# Patient Record
Sex: Female | Born: 1940
Health system: Southern US, Community
[De-identification: ages and names within clinical notes are randomized; demographics above are authoritative.]

## PROBLEM LIST (undated history)

## (undated) DIAGNOSIS — G459 Transient cerebral ischemic attack, unspecified: Secondary | ICD-10-CM

## (undated) DIAGNOSIS — I10 Essential (primary) hypertension: Secondary | ICD-10-CM

## (undated) HISTORY — PX: DILATION AND CURETTAGE OF UTERUS: SHX78

## (undated) HISTORY — DX: Essential (primary) hypertension: I10

## (undated) HISTORY — PX: HEMORROIDECTOMY: SUR656

## (undated) HISTORY — PX: EYE SURGERY: SHX253

## (undated) HISTORY — PX: APPENDECTOMY: SHX54

---

## 1983-07-01 HISTORY — PX: BREAST SURGERY: SHX581

## 1994-06-30 HISTORY — PX: BREAST SURGERY: SHX581

## 2004-05-06 ENCOUNTER — Ambulatory Visit: Payer: Self-pay | Admitting: Family Medicine

## 2004-05-27 ENCOUNTER — Encounter (INDEPENDENT_AMBULATORY_CARE_PROVIDER_SITE_OTHER): Payer: Self-pay | Admitting: Specialist

## 2004-05-27 ENCOUNTER — Observation Stay (HOSPITAL_COMMUNITY): Admission: RE | Admit: 2004-05-27 | Discharge: 2004-05-28 | Payer: Self-pay | Admitting: Gynecology

## 2004-06-30 HISTORY — PX: ABDOMINAL HYSTERECTOMY: SHX81

## 2006-03-12 ENCOUNTER — Ambulatory Visit: Payer: Self-pay | Admitting: Obstetrics & Gynecology

## 2008-04-03 ENCOUNTER — Ambulatory Visit: Payer: Self-pay | Admitting: Family Medicine

## 2008-04-13 ENCOUNTER — Ambulatory Visit: Payer: Self-pay | Admitting: Otolaryngology

## 2008-08-01 ENCOUNTER — Ambulatory Visit: Payer: Self-pay | Admitting: Otolaryngology

## 2008-08-10 ENCOUNTER — Ambulatory Visit: Payer: Self-pay | Admitting: Otolaryngology

## 2010-11-15 NOTE — H&P (Signed)
NAME:  KENLI, WALDO NO.:  [id number]   MEDICAL RECORD NO.:  [id number]          PATIENT TYPE:  AMB   LOCATION:  [facility]C                           [facility]ACILITY:  [facility]   PHYSICIAN:  Ginger Carne, MD  DATE OF BIRTH:  1941/03/10   DATE OF ADMISSION:  DATE OF DISCHARGE:                                HISTORY & PHYSICAL   SCHEDULED DATE OF SURGERY:  May 27, 2004   ADMITTING DIAGNOSES:  1.  Postmenopausal bleeding.  2.  Right ovarian cyst.   PROPOSED PROCEDURE:  Total vaginal hysterectomy, bilateral salpingo-  oophorectomy, with possible laparoscopic approach.   HISTORY OF PRESENT ILLNESS:  This is a 70 year old Caucasian female gravida  8 para 3-0-5-3 admitted for the aforementioned procedures because of a 2-  year history of postmenopausal bleeding.  The patient was seen initially by  another gynecological practice in [place], at which time an endometrial  biopsy for postmenopausal bleeding revealed benign findings including  atrophic lining.  A sonogram performed abdominally due to the patient  declining a vaginal approach demonstrated the uterus to be of normal size  with an endometrial stripe of 6 mm.  She has not been on hormone replacement  therapy.  In addition, a right ovarian cyst measuring approximately 3 cm was  also noted.  The patient has had sequential ultrasounds over this period of  time, approximately 4 to 6 months apart, which demonstrated no change in the  endometrial thickening, in addition to the cyst remaining stable.  The  patient has since not been amenable to a transvaginal scan and has declined  an endometrial biopsy.  She had continued to have postmenopausal bleeding  and is concerned and has anxiety related to the possibilities of carcinoma  of the endometrium and the nature of said ovarian cyst on the right ovary.  CA-125 and CEA were normal.   OBSTETRICAL AND GYNECOLOGICAL HISTORY:  The patient has had three full-term  vaginal  deliveries and has had five first trimester miscarriages with a  report of Rh sensitization in one of the past pregnancies - she is uncertain  as to which one.   MEDICAL HISTORY:  Hypertension.   PAST SURGICAL HISTORY:  Includes two right breast cysts which were  aspirated.  She has had bilateral laser surgery for glaucoma and has had her  appendix removed in the past.   MEDICATIONS:  Maxzide 32.5/25 daily.   ALLERGIES:  1.  DEMEROL.  2.  DARVOCET.  3.  PENICILLIN.  4.  IODINE.   FAMILY HISTORY:  Positive for mother having ovarian carcinoma and her  brother having a myocardial infarction.   SOCIAL HISTORY:  Negative for smoking, illicit drug abuse, or alcohol abuse.   REVIEW OF SYSTEMS:  Normal.   PHYSICAL EXAMINATION:  VITAL SIGNS:  Blood pressure 126/82, height 5 feet 1  inch, weight 118 pounds.  HEENT:  Grossly normal.  BREAST:  Without masses, discharge, thickenings, or tenderness.  Recent  mammogram within normal limits.  CHEST:  Clear to percussion and auscultation.  CARDIOVASCULAR:  Without murmurs or enlargements, regular  rate and rhythm.  VASCULAR, EXTREMITY, LYMPHATIC, SKIN, NEUROLOGICAL, MUSCULOSKELETAL:  Within  normal limits.  ABDOMEN:  Supple without gross hepatosplenomegaly.  PELVIC:  External genitalia, vulva, and vagina normal.  Cervix smooth  without erosions or lesions.  The uterus is small, anteverted, and flexed.  Both adnexa are palpable and clinically found to be normal.  Rectal exam is  hemoccult negative without masses.   IMPRESSION:  1.  Stable but persistent right ovarian cyst, 2.5 cm.  2.  Postmenopausal bleeding with a greater than 1 year history of a normal      endometrial biopsy and activities of daily living sonogram findings of      an endometrial stripe at 6 mm.   PLAN:  The patient's apprehension about these findings is tempered by her  lack of appreciation about the benign nature of a stable small ovarian cyst  in a postmenopausal  woman and her refusal to have a transvaginal ultrasound  in addition to an endometrial biopsy which would better delineate the  etiology and pathology of these lesions.  [name] extremely worried about the  possibility of carcinoma, based on her mother's past history.  She has  sought advice from several gynecologists, as well as a family doctor in the  area, but it appears that she has never been given determinate options.  She  states that she is uncomfortable living with postmenopausal bleeding and she  is equally uncomfortable with having these findings, although stable,  persist.  [name] well aware that the information provided to her is somewhat  qualified because of her limitations in testing.   In the end, however, the patient does not wish to persist or continue with  any further testing and would be more comfortable in undergoing a total  vaginal hysterectomy, bilateral salpingo-oophorectomy, with possible  laparoscopic assistance in removal of atrophic ovaries.  She understands  that in all likelihood the tissue pathology for both the ovaries uterus, and  cervix will be benign.  However, there is certainly a small but not 0%  chance that there could be underlying pathology that has gone unnoticed and  not been detected because of limitations in testing.  The nature of said  procedure discussed in detail with the patient.  Risks including injuries to  ureter, bowel, and bladder; possible conversion to a laparoscopic or open  procedure; hemorrhage possibly requiring blood transfusion; infection; and  other unforeseen complications were discussed and understood by said  patient.  [name] scheduled for such surgery May 27, 2004.     Stev   SHB/MEDQ  D:  05/22/2004  T:  05/22/2004  Job:  [id number]

## 2010-11-15 NOTE — Discharge Summary (Signed)
NAME:  Mariah Levy, Mariah Levy NO.:  [id number]   MEDICAL RECORD NO.:  [id number]          PATIENT TYPE:  OBV   LOCATION:  9315                          [facility]ILITY:  [facility]   PHYSICIAN:  Ginger Carne, MD  DATE OF BIRTH:  [birth year]   DATE OF ADMISSION:  05/27/2004  DATE OF DISCHARGE:  05/28/2004                                 DISCHARGE SUMMARY   FINAL DIAGNOSIS:  Postmenopausal bleeding with right paratubal cyst.   IN HOSPITAL PROCEDURES:  Total vaginal hysterectomy with bilateral salpingo-  oophorectomy.   HOSPITAL COURSE:  This is a 70 year old Caucasian female who underwent the  aforementioned procedures on May 27, 2004.  Postoperative course was  uneventful.  Blood pressure on discharge was 123/56, the temperature 97.8.  Hemoglobin was 11.4 from preoperative of 14.5 and hematocrit of 32.6 and  preoperative of 42.2.  She had no specific complaints.  The lungs were  clear.  Cardiac status within normal limits and her calves demonstrated  bilaterally no tenderness.  She had no vaginal bleeding and no difficulty  with voiding.  The abdomen was soft.   Routine discharge instructions were provided to said patient including  avoiding constipation.  The patient was advised to use Colace 100 mg twice a  day and Dulcolax tablets as needed, increase fluids and roughage.  She was  also given Dilaudid 2 mg, one tablet every four to six hours as necessary  for pain.  She was advised to contact the office if she had temperature  elevation above 100.4 degrees Fahrenheit, vaginal bleeding, increasing  discharge, difficulty with voiding or increasing pelvic or abdominal pain.  The patient was advised to resume all activities and she will be seen in the  office in four weeks time for follow up postoperative care.     Stev   SHB/MEDQ  D:  05/28/2004  T:  05/28/2004  Job:  [id number]

## 2010-11-15 NOTE — Op Note (Signed)
NAME:  Mariah Levy, Mariah Levy                 ACCOUNT NO.:  [id number]   MEDICAL RECORD NO.:  [id number]          PATIENT TYPE:  AMB   LOCATION:  [facility]C                           [facility]   PHYSICIAN:  Ginger Carne, MD  DATE OF BIRTH:  02-Jun-1941   DATE OF PROCEDURE:  05/27/2004  DATE OF DISCHARGE:                                 OPERATIVE REPORT   PREOPERATIVE DIAGNOSES:  Postmenopausal bleeding, thickened endometrial  lining and right ovarian cyst.   POSTOPERATIVE DIAGNOSES:  Postmenopausal bleeding, thickened right  endometrial lining and right paratubal cyst.   PROCEDURE:  Total vaginal hysterectomy, bilateral salpingo-oophorectomy.   SURGEON:  Ginger Carne, MD   ASSISTANT:  Bing Neighbors. Delcambre, MD   ESTIMATED BLOOD LOSS:  Less than 75 mL.   COMPLICATIONS:  None immediate.   ANESTHESIA:  __________ with general.   SPECIMENS:  Right and left tube and ovary with uterus and cervix.   FINDINGS:  External genitalia, vulva and vagina normal, cervix smooth  without erosions or lesions.  The uterus was small consistent with the  patient's age, left tube and ovary were atrophic, right tube and ovary  appeared normal. There was a 2 cm right paratubal cyst which was simple in  appearance and when opened appeared to be free of excrescences.   DESCRIPTION OF PROCEDURE:  The patient was prepped and draped in the usual  fashion and placed in the lithotomy position. Betadine solution used for  antiseptic and the patient was catheterized prior to the procedure. After  adequate general anesthesia, copious irrigation with lactated Ringer's was  followed by grasping the anterior and posterior lips of the vagina. 20 mL of  Marcaine with epinephrine was injected circumferentially around the cervix.  This was then followed by opening the anterior and posterior vaginal  epithelium and peritoneal reflections opened without injury to their  respective organs. Uterosacral cardinal ligament  complexes were clamped, cut  and ligated attached to their respective vaginal cuff walls.  The uterine  vasculature with its ascending branches were clamped, cut, and ligated with  #0 Vicryl suture including the broad ligaments. The uteroovarian ligaments  were then clamped, cut, and ligated with #0 Vicryl suture and then attention  was paid to complete removal of both right and left tube and ovary with the  right paratubal cyst included.  Double tying with #0 Vicryl sutures were  placed on these  stumps to assure hemostasis, no active bleeding noted. Cuff closed with a  single layer of #0 Vicryl running interlocking suture.  The patient  tolerated the procedure well and transported to anesthesia recovery room in  excellent condition.     Stev   SHB/MEDQ  D:  05/27/2004  T:  05/27/2004  Job:  [id number]

## 2010-11-15 NOTE — Group Therapy Note (Signed)
NAME:  Mariah Levy, IGO NO.:  [id number]   MEDICAL RECORD NO.:  [id number]          PATIENT TYPE:  POB   LOCATION:  [facility]                   FACILITY:  [facility]   PHYSICIAN:  Elsie Lincoln, MD      DATE OF BIRTH:  09-01-1940   DATE OF SERVICE:  03/12/2006                                    CLINIC NOTE   Patient is a 70 year old female status post total hysterectomy/BSO in 2005  who presents complaining of vaginal irritation.  Reports it started seven  weeks ago with one spot on the left side and has then progressed to her  vagina, and vulvar area is very itchy.  She denies any history of yeast  infections, even as a young woman.  Denies any change in sexual partners.  She has been married for the last 47 years to the same man.  Denies using  soap in her vaginal area.  She uses a shower head to cleanse daily.  Denies  douching or using feminine deodorant.  [name] not on any estrogen therapy.   PHYSICAL EXAMINATION:  VITAL SIGNS:  Blood pressure 163/90, pulse 75.  Weight 116.  Height 5 feet 1.  GENERAL:  A well-developed and well-nourished elderly female who appears  very anxious.  ABDOMEN:  Soft, nontender, nondistended.  GU:  External genitalia is very erythematous.  Her labia majora and minora  have a shiny appearance, consistent with estrogen deficiency.  Vaginal  mucosa is pink and irritated-appearing.  Vaginal exam reveals a normal-  appearing vaginal cuff.  No ulcers or lesions noted.   ASSESSMENT/PLAN:  1. Pruritus secondary to estrogen deficiency:  Wet prep today within      normal limits.  Recommend patient start using Premarin cream 1 gm per      vagina daily x1 week, then 1 gm twice daily.  Patient was also      counseled to avoid sitting in the pool water, which she also reports.      If this does not relieve her problem, she is to return for followup.  2. Hypertension:  Patient is followed by primary care physician for this.      [name] to continue  her Maxzide.  She reports that she is usually in the      120s/70s, so her elevated pressure is probably secondary to __________.     ______________________________  Carolanne Grumbling, M.D.    ______________________________  Elsie Lincoln, MD    TW/MEDQ  D:  03/12/2006  T:  03/12/2006  Job:  [id number]

## 2010-12-25 ENCOUNTER — Ambulatory Visit: Payer: Self-pay | Admitting: Ophthalmology

## 2011-01-22 ENCOUNTER — Ambulatory Visit: Payer: Self-pay | Admitting: Ophthalmology

## 2012-10-12 ENCOUNTER — Encounter: Payer: Self-pay | Admitting: Obstetrics and Gynecology

## 2012-10-12 ENCOUNTER — Ambulatory Visit (INDEPENDENT_AMBULATORY_CARE_PROVIDER_SITE_OTHER): Payer: MEDICARE | Admitting: Obstetrics and Gynecology

## 2012-10-12 VITALS — BP 152/83 | HR 76 | Ht 61.0 in | Wt 100.2 lb

## 2012-10-12 DIAGNOSIS — Z1239 Encounter for other screening for malignant neoplasm of breast: Secondary | ICD-10-CM

## 2012-10-12 NOTE — Progress Notes (Signed)
  Subjective:    Patient ID: Mariah Levy, female    DOB: 15-Oct-1940, 72 y.o.   MRN: [id number]  HPI  72 yo postmenopausal female here for evaluation of redness on right breast. Patient reports noticing a bright red area on breast a week ago which has gradually started to fade. The area is not painful but she was worried enough to want to have it evaluated given her history of lumpectomy a few years ago. Patient is also complaining of vulva itching and burning on occasions. She denies any discharge but states a sharp pain occurs every once an a while. Patient has not had a mammogram in over a year and half and is not interested in having a pelvic exam either.  Past Medical History  Diagnosis Date  . Hypertension    Past Surgical History  Procedure Laterality Date  . Breast surgery Right 1985    lumpectomy x 2  . Breast surgery Right 1996    milk duct removal  . Abdominal hysterectomy  2006  . Eye surgery      cataract/ glaucoma   History   Social History  . Marital Status: Married    Spouse Name: N/A    Number of Children: N/A  . Years of Education: N/A   Occupational History  . Not on file.   Social History Main Topics  . Smoking status: Never Smoker   . Smokeless tobacco: Never Used  . Alcohol Use: Not on file  . Drug Use: Not on file  . Sexually Active: Not on file   Other Topics Concern  . Not on file   Social History Narrative  . No narrative on file   Family History  Problem Relation Age of Onset  . Hypertension Mother   . Cancer Father      Review of Systems  All other systems reviewed and are negative.       Objective:   Physical Exam GENERAL: Well-developed, well-nourished female in no acute distress.  NECK: Supple. Normal thyroid.  BREASTS: Symmetric in size. No palpable masses or lymphadenopathy, skin changes, or nipple drainage. Small 0.5 cm area or erythema on right breast similar to insect bite. ABDOMEN: Soft, nontender, nondistended. No  organomegaly. PELVIC: Normal external female genitalia with atrophic changes to skin of labia minora and clitoral hood as well as introitus EXTREMITIES: No cyanosis, clubbing, or edema, 2+ distal pulses.      Assessment & Plan:  72 yo with normal breast exam - Referral for breast mammogram provided - Explained to patient that vulva discomfort may be due to atrophy secondary to lack pf estrogen but cannot rule out VIN. Patient declined any medical intervention at this time. - RTC prn - Patient stated she is scheduled for physical exam with PCP in

## 2012-10-12 NOTE — Progress Notes (Signed)
Here today for red spot on right breast with some pain associated.  Has a history of breast lumpectomy x 2 both were benign.  Also has noticed a lump/knot on her neck (right side).  Has a intense itch at her "pelvic bone" area and a red spot on her right labia that will not go away.

## 2014-10-23 DIAGNOSIS — H3531 Nonexudative age-related macular degeneration: Secondary | ICD-10-CM | POA: Diagnosis not present

## 2014-10-31 DIAGNOSIS — L570 Actinic keratosis: Secondary | ICD-10-CM | POA: Diagnosis not present

## 2014-10-31 DIAGNOSIS — L578 Other skin changes due to chronic exposure to nonionizing radiation: Secondary | ICD-10-CM | POA: Diagnosis not present

## 2014-12-05 DIAGNOSIS — D2261 Melanocytic nevi of right upper limb, including shoulder: Secondary | ICD-10-CM | POA: Diagnosis not present

## 2014-12-05 DIAGNOSIS — L570 Actinic keratosis: Secondary | ICD-10-CM | POA: Diagnosis not present

## 2014-12-20 DIAGNOSIS — S61219A Laceration without foreign body of unspecified finger without damage to nail, initial encounter: Secondary | ICD-10-CM | POA: Diagnosis not present

## 2014-12-20 DIAGNOSIS — S61217A Laceration without foreign body of left little finger without damage to nail, initial encounter: Secondary | ICD-10-CM | POA: Diagnosis not present

## 2014-12-20 DIAGNOSIS — S62639B Displaced fracture of distal phalanx of unspecified finger, initial encounter for open fracture: Secondary | ICD-10-CM | POA: Diagnosis not present

## 2014-12-22 DIAGNOSIS — S68125A Partial traumatic metacarpophalangeal amputation of left ring finger, initial encounter: Secondary | ICD-10-CM | POA: Diagnosis not present

## 2014-12-27 DIAGNOSIS — S68129D Partial traumatic metacarpophalangeal amputation of unspecified finger, subsequent encounter: Secondary | ICD-10-CM | POA: Diagnosis not present

## 2015-01-15 ENCOUNTER — Ambulatory Visit
Admission: RE | Admit: 2015-01-15 | Discharge: 2015-01-15 | Disposition: A | Discharge status: Home / Self Care | Payer: MEDICARE | Source: Ambulatory Visit | Attending: Family Medicine | Admitting: Family Medicine

## 2015-01-15 ENCOUNTER — Ambulatory Visit (INDEPENDENT_AMBULATORY_CARE_PROVIDER_SITE_OTHER): Payer: MEDICARE | Admitting: Family Medicine

## 2015-01-15 ENCOUNTER — Encounter: Payer: Self-pay | Admitting: Family Medicine

## 2015-01-15 VITALS — BP 146/80 | HR 76 | Temp 98.2°F | Resp 16 | Ht 61.0 in | Wt 101.0 lb

## 2015-01-15 DIAGNOSIS — S68129D Partial traumatic metacarpophalangeal amputation of unspecified finger, subsequent encounter: Secondary | ICD-10-CM | POA: Diagnosis not present

## 2015-01-15 DIAGNOSIS — J338 Other polyp of sinus: Secondary | ICD-10-CM | POA: Insufficient documentation

## 2015-01-15 DIAGNOSIS — R109 Unspecified abdominal pain: Secondary | ICD-10-CM

## 2015-01-15 DIAGNOSIS — E7800 Pure hypercholesterolemia, unspecified: Secondary | ICD-10-CM | POA: Insufficient documentation

## 2015-01-15 DIAGNOSIS — I10 Essential (primary) hypertension: Secondary | ICD-10-CM | POA: Insufficient documentation

## 2015-01-15 DIAGNOSIS — G4730 Sleep apnea, unspecified: Secondary | ICD-10-CM | POA: Insufficient documentation

## 2015-01-15 DIAGNOSIS — K5900 Constipation, unspecified: Secondary | ICD-10-CM

## 2015-01-15 DIAGNOSIS — R198 Other specified symptoms and signs involving the digestive system and abdomen: Secondary | ICD-10-CM

## 2015-01-15 DIAGNOSIS — R194 Change in bowel habit: Secondary | ICD-10-CM | POA: Diagnosis not present

## 2015-01-15 DIAGNOSIS — R197 Diarrhea, unspecified: Secondary | ICD-10-CM | POA: Diagnosis not present

## 2015-01-15 NOTE — Progress Notes (Signed)
Subjective:    Patient ID: Mariah Levy, female    DOB: 1940/10/09, 74 y.o.   MRN: [id number]  Constipation This is a new problem. The current episode started 1 to 4 weeks ago. The stool is described as pellet like and oily. Associated symptoms include abdominal pain (Lower abdominal pain/cramping.), bloating and diarrhea. Pertinent negatives include no difficulty urinating, fever, nausea, rectal pain or vomiting.   Does not really feel constipated.  Was previously regular. Had impaction and it got better.  Does still have impaction initially and then normal BM.  Then had mucus coming out. Now regular since then. Does have mucus and marbles at the time.  Happened several times.    Has not taken any medication for it.  No fevers.    For finger, is now  infected. Was on antibiotic previously. Not currently. Is not in the bone. X-ray was negative.  Patient Active Problem List   Diagnosis Date Noted  . Hypercholesteremia 01/15/2015  . BP (high blood pressure) 01/15/2015  . Polyp of nasal sinus 01/15/2015  . Apnea, sleep 01/15/2015   Family History  Problem Relation Age of Onset  . Hypertension Mother   . Arthritis Mother   . Stroke Mother   . Cancer Father   . Aneurysm Father   . Hypertension Brother   . Diabetes Brother   . Coronary artery disease Brother   . Cancer Brother   . COPD Brother   . Cancer Brother     lung  . Early death Brother     Died as infant  . Pneumonia Brother   . Diabetes Son   . Diabetes Maternal Grandmother    History   Social History  . Marital Status: Married    Spouse Name: N/A  . Number of Children: 3  . Years of Education: H/S   Occupational History  . Retired    Social History Main Topics  . Smoking status: Former Smoker -- 1.00 packs/day for 2 years    Quit date: 06/30/1959  . Smokeless tobacco: Never Used  . Alcohol Use: 4.2 oz/week    7 Glasses of wine per week     Comment: drinks wine with dinner  . Drug Use: No  . Sexual  Activity: Not on file   Other Topics Concern  . Not on file   Social History Narrative   Allergies  Allergen Reactions  . Atacand  [Candesartan] Shortness Of Breath  . Iodine Shortness Of Breath  . Demerol [Meperidine] Hives  . Ramipril Swelling    Paresthesias.  . Penicillins Rash   Previous Medications   ASPIRIN 81 MG TABLET    Take by mouth.   TRIAMTERENE-HYDROCHLOROTHIAZIDE (MAXZIDE) 75-50 MG PER TABLET    Take by mouth.   Allergies  Allergen Reactions  . Atacand  [Candesartan] Shortness Of Breath  . Iodine Shortness Of Breath  . Demerol [Meperidine] Hives  . Ramipril Swelling    Paresthesias.  . Penicillins Rash   Previous Medications   ASPIRIN 81 MG TABLET    Take by mouth.   TRIAMTERENE-HYDROCHLOROTHIAZIDE (MAXZIDE) 75-50 MG PER TABLET    Take by mouth.   BP 146/80 mmHg  Pulse 76  Temp(Src) 98.2 F (36.8 C) (Oral)  Resp 16  Ht 5\' 1"  (1.549 m)  Wt 101 lb (45.813 kg)  BMI 19.09 kg/m2    Review of Systems  Constitutional: Negative for fever, chills, diaphoresis, activity change, appetite change, fatigue and unexpected weight change.  Gastrointestinal: Positive  for abdominal pain (Lower abdominal pain/cramping.), diarrhea, constipation, abdominal distention (Maybe slightly.) and bloating. Negative for nausea, vomiting, blood in stool, anal bleeding and rectal pain.  Genitourinary: Negative for dysuria, urgency, frequency, hematuria, flank pain, decreased urine volume, vaginal bleeding, vaginal discharge, enuresis, difficulty urinating, genital sores, vaginal pain, menstrual problem, pelvic pain and dyspareunia.       Objective:   Physical Exam  Constitutional: She appears well-developed and well-nourished.  Cardiovascular: Normal rate and regular rhythm.   Pulmonary/Chest: Effort normal and breath sounds normal.  Abdominal: Soft. Bowel sounds are normal. She exhibits distension. There is no tenderness. There is no rebound.  Hyperactive bowel sounds.    BP  146/80 mmHg  Pulse 76  Temp(Src) 98.2 F (36.8 C) (Oral)  Resp 16  Ht 5\' 1"  (1.549 m)  Wt 101 lb (45.813 kg)  BMI 19.09 kg/m2     Assessment & Plan:  1. Constipation, unspecified constipation type Will check X-rays and reassess.  - DG Abd 2 Views; Future  2. Change in bowel function Unclear if diarrhea or constipation. Will assess before treating.   3. Diarrhea Will check C diff. Has been on antibiotic recently.  - Stool C-Diff Toxin Assay  4. Abdominal pain, unspecified abdominal location Will check labs and X-ray and treat accordingly. Call if pain worsens or develops fever.  - CBC with Differential/Platelet - Comprehensive metabolic panel - DG Abd 2 Views; Future  Margarita Rana, MD

## 2015-01-16 ENCOUNTER — Telehealth: Payer: Self-pay

## 2015-01-16 DIAGNOSIS — R197 Diarrhea, unspecified: Secondary | ICD-10-CM | POA: Diagnosis not present

## 2015-01-16 LAB — CBC WITH DIFFERENTIAL/PLATELET
BASOS ABS: 0 10*3/uL (ref 0.0–0.2)
Basos: 0 %
EOS (ABSOLUTE): 0.1 10*3/uL (ref 0.0–0.4)
Eos: 2 %
HEMOGLOBIN: 13.5 g/dL (ref 11.1–15.9)
Hematocrit: 39.7 % (ref 34.0–46.6)
Immature Grans (Abs): 0 10*3/uL (ref 0.0–0.1)
Immature Granulocytes: 0 %
Lymphocytes Absolute: 3.2 10*3/uL — ABNORMAL HIGH (ref 0.7–3.1)
Lymphs: 39 %
MCH: 31.6 pg (ref 26.6–33.0)
MCHC: 34 g/dL (ref 31.5–35.7)
MCV: 93 fL (ref 79–97)
MONOCYTES: 13 %
MONOS ABS: 1.1 10*3/uL — AB (ref 0.1–0.9)
NEUTROS ABS: 3.7 10*3/uL (ref 1.4–7.0)
NEUTROS PCT: 46 %
Platelets: 351 10*3/uL (ref 150–379)
RBC: 4.27 x10E6/uL (ref 3.77–5.28)
RDW: 13.8 % (ref 12.3–15.4)
WBC: 8.1 10*3/uL (ref 3.4–10.8)

## 2015-01-16 LAB — COMPREHENSIVE METABOLIC PANEL
A/G RATIO: 1.7 (ref 1.1–2.5)
ALBUMIN: 4.5 g/dL (ref 3.5–4.8)
ALK PHOS: 47 IU/L (ref 39–117)
ALT: 21 IU/L (ref 0–32)
AST: 28 IU/L (ref 0–40)
BUN/Creatinine Ratio: 22 (ref 11–26)
BUN: 16 mg/dL (ref 8–27)
Bilirubin Total: 0.4 mg/dL (ref 0.0–1.2)
CALCIUM: 9.7 mg/dL (ref 8.7–10.3)
CHLORIDE: 99 mmol/L (ref 97–108)
CO2: 25 mmol/L (ref 18–29)
Creatinine, Ser: 0.72 mg/dL (ref 0.57–1.00)
GFR calc Af Amer: 95 mL/min/{1.73_m2} (ref >59)
GFR calc non Af Amer: 83 mL/min/{1.73_m2} (ref >59)
Globulin, Total: 2.6 g/dL (ref 1.5–4.5)
Glucose: 67 mg/dL (ref 65–99)
Potassium: 4.6 mmol/L (ref 3.5–5.2)
Sodium: 141 mmol/L (ref 134–144)
Total Protein: 7.1 g/dL (ref 6.0–8.5)

## 2015-01-16 NOTE — Telephone Encounter (Signed)
-----   Message from Margarita Rana, MD sent at 01/16/2015  8:16 AM EDT ----- Last stable.  No sign of infection. C Diff toxin pending. Abdominal Xray does show constipation.  Recommend Miralax every night and colace twice a day. Call if any fever or worsening. Thanks. Will cal back when c diff culture completed. Thanks.

## 2015-01-16 NOTE — Telephone Encounter (Signed)
Informed pt of results. Renaldo Fiddler, CMA

## 2015-01-16 NOTE — Telephone Encounter (Signed)
LMTCB 01/16/2015  Thanks,   -Mickel Baas

## 2015-01-18 ENCOUNTER — Telehealth: Payer: Self-pay

## 2015-01-18 LAB — CLOSTRIDIUM DIFFICILE EIA: C difficile Toxins A+B, EIA: NEGATIVE

## 2015-01-18 NOTE — Telephone Encounter (Signed)
-----   Message from Margarita Rana, MD sent at 01/18/2015 10:30 AM EDT ----- C diff negative. Please see how patient is doing. Thanks.

## 2015-01-18 NOTE — Telephone Encounter (Signed)
Pt advised.  She says she is still a little constipated but seems to be improving some.  I advised her to let us know if it doesn't continue to improve or gets worse.  Thanks,   -Mickel Baas

## 2015-01-30 DIAGNOSIS — L853 Xerosis cutis: Secondary | ICD-10-CM | POA: Diagnosis not present

## 2015-01-30 DIAGNOSIS — Z808 Family history of malignant neoplasm of other organs or systems: Secondary | ICD-10-CM | POA: Diagnosis not present

## 2015-01-30 DIAGNOSIS — L821 Other seborrheic keratosis: Secondary | ICD-10-CM | POA: Diagnosis not present

## 2015-01-30 DIAGNOSIS — Z1283 Encounter for screening for malignant neoplasm of skin: Secondary | ICD-10-CM | POA: Diagnosis not present

## 2015-02-14 DIAGNOSIS — S68129D Partial traumatic metacarpophalangeal amputation of unspecified finger, subsequent encounter: Secondary | ICD-10-CM | POA: Diagnosis not present

## 2015-05-21 ENCOUNTER — Encounter: Payer: Self-pay | Admitting: Family Medicine

## 2015-05-21 ENCOUNTER — Telehealth: Payer: Self-pay | Admitting: Family Medicine

## 2015-05-21 ENCOUNTER — Ambulatory Visit
Admission: RE | Admit: 2015-05-21 | Discharge: 2015-05-21 | Disposition: A | Discharge status: Home / Self Care | Payer: MEDICARE | Source: Ambulatory Visit | Attending: Family Medicine | Admitting: Family Medicine

## 2015-05-21 ENCOUNTER — Ambulatory Visit (INDEPENDENT_AMBULATORY_CARE_PROVIDER_SITE_OTHER): Payer: MEDICARE | Admitting: Family Medicine

## 2015-05-21 VITALS — BP 124/88 | HR 76 | Temp 97.7°F | Resp 14 | Wt 100.4 lb

## 2015-05-21 DIAGNOSIS — G459 Transient cerebral ischemic attack, unspecified: Secondary | ICD-10-CM | POA: Diagnosis not present

## 2015-05-21 DIAGNOSIS — R200 Anesthesia of skin: Secondary | ICD-10-CM | POA: Diagnosis not present

## 2015-05-21 DIAGNOSIS — R42 Dizziness and giddiness: Secondary | ICD-10-CM | POA: Insufficient documentation

## 2015-05-21 DIAGNOSIS — E785 Hyperlipidemia, unspecified: Secondary | ICD-10-CM

## 2015-05-21 DIAGNOSIS — Z8673 Personal history of transient ischemic attack (TIA), and cerebral infarction without residual deficits: Secondary | ICD-10-CM | POA: Diagnosis not present

## 2015-05-21 DIAGNOSIS — I10 Essential (primary) hypertension: Secondary | ICD-10-CM | POA: Insufficient documentation

## 2015-05-21 NOTE — Telephone Encounter (Signed)
Too short staffed to see her today. Needs to see mid-level or go to ER. Thanks.

## 2015-05-21 NOTE — Telephone Encounter (Signed)
Patient has appointment scheduled for tomorrow at 10:45   Thanks,  -JER

## 2015-05-21 NOTE — Telephone Encounter (Signed)
Does also have some facial numbness today.   Had weakness last night.  Will schedule ov today.  Thanks.

## 2015-05-21 NOTE — Patient Instructions (Signed)
Continue blood pressure medication and 81 mg. Aspirin. Get you blood drawn fasting for lipid profile.

## 2015-05-21 NOTE — Telephone Encounter (Signed)
Pt called experiencing vertigo and weakness this weekend.  Can we work her in today and can she wait until tomorrow.    Please call patient [phone]  Thanks Con Memos

## 2015-05-21 NOTE — Telephone Encounter (Signed)
Spoke with dr. Venia Minks to verify that pt should come in today and not wait until tomorrow. Dr. Venia Minks would like a nurse to triage pt. I spoke with Roshena and she is going to triage pt. Thanks TNP

## 2015-05-21 NOTE — Telephone Encounter (Signed)
Can not work in today. Can see mid-level or ok to work in tomorrow. Thanks.

## 2015-05-21 NOTE — Telephone Encounter (Signed)
Called Mariah Levy and she didn't want to see anyone other than Dr. Venia Minks. I schedule Mariah Levy tomorrow at 1045. Mariah Levy stated she had a sever attack of vertigo in the middle of the night and it last a couple hours. Mariah Levy stated she was concerned b/c it felt like her left leg couldn't support her. Thanks TNP

## 2015-05-21 NOTE — Progress Notes (Signed)
Subjective:     Patient ID: Mariah Levy, female   DOB: 08-25-1940, 74 y.o.   MRN: [id number]  HPI  Chief Complaint  Patient presents with  . Dizziness    Patient comes in office today with concerns of vertigo. Patient states that episode began yesterday around 2:30, patient reports today upon standing in her bedroom it felt like the room was turning and her legs buckled.   States she started to get up as she usually does at 2:30 in the AM 11/20. Had significant vertigo and when she tried to get to the bathroom her left leg buckled. She returned to bed and when she arose again her prior sx were gone but she had left facial paresthesias which persist. Reports prior TIA associated with vertigo 20 years ago. Reports compliance with bp medication and ASA though might have missed one day at the end of last week. Last lipid profile 2012 with LDL 134 and HDL 84.   Review of Systems  Cardiovascular:       Noticed transient heart racing when she got scared about her sx.       Objective:   Physical Exam  Constitutional: She appears well-developed and well-nourished. No distress.  HENT:  Bilateral obstructing ear cerumen.  Eyes: EOM are normal. Pupils are equal, round, and reactive to light.  Neck:  No carotid bruits  Cardiovascular: Normal rate and regular rhythm.   Pulmonary/Chest: Breath sounds normal.  Neurological:  Symmetric face with smile/tongue protruded at midline with good lateral movement.Finger to Nose and Heel to Shin WNL. Romberg negative. M.S.: grips and lower extremities 5/5.       Assessment:    1. Transient cerebral ischemia, unspecified transient cerebral ischemia type  - CT Head Wo Contrast; Future - US Carotid Duplex Bilateral; Future  2. Hyperlipidemia - Lipid panel    Plan:    Discussed reporting to the ER if her sx return or worsen. Case discussed with her primary M.D., Dr. Venia Minks. Continue with bp medication and ASA.

## 2015-05-21 NOTE — Telephone Encounter (Signed)
Called patient. She states that on Sunday morning at 2:30am she woke up to go to the restroom. She states the room felt like it was spinning and her left leg was weak like it was about to give out on her. She also has a headache, haert palpitations and felt out of breath just walking to the restroom. Patient states her husband helped her back into bed and she lay there for several hours until symptoms resolved. Patient states Today she is not having any vertigo symptoms or weakness in her leg. She states she is having some numbness on the left side of her face. Patient denies any slurred speech, blurry vision or chest pain.

## 2015-05-22 ENCOUNTER — Encounter: Payer: Self-pay | Admitting: Family Medicine

## 2015-05-22 ENCOUNTER — Ambulatory Visit (INDEPENDENT_AMBULATORY_CARE_PROVIDER_SITE_OTHER): Payer: MEDICARE | Admitting: Family Medicine

## 2015-05-22 VITALS — BP 120/80 | HR 72 | Temp 98.1°F | Resp 16 | Wt 100.4 lb

## 2015-05-22 DIAGNOSIS — I10 Essential (primary) hypertension: Secondary | ICD-10-CM | POA: Diagnosis not present

## 2015-05-22 DIAGNOSIS — G459 Transient cerebral ischemic attack, unspecified: Secondary | ICD-10-CM | POA: Diagnosis not present

## 2015-05-22 DIAGNOSIS — E7800 Pure hypercholesterolemia, unspecified: Secondary | ICD-10-CM | POA: Diagnosis not present

## 2015-05-22 NOTE — Progress Notes (Signed)
Patient: Mariah Levy Female    DOB: [birth year]   74 y.o.   MRN: [id number] Visit Date: 05/22/2015  Today's Provider: Margarita Rana, MD   Chief Complaint  Patient presents with  . Numbness   Subjective:    HPI  Mariah Levy is a 74 year old concern about Facial Numbness on the Left side of her face. Patient reports is just numbness, no tingling, no pain, no chest pain. Patient was seen yesterday with one of our PA's. Patient had CT scan done results are in EMR.  Symptoms have not worsened, is actually some better. Numbness is in her mouth, in her cheek. Has had this previously.    Also, having a lot of anxiety.   Husband has been ill, having trouble swallowing and has had trouble with work up.  Does not have plan about next step. Thinks this may be more of a problem than she thought, now that she started talking about it.      Allergies  Allergen Reactions  . Atacand  [Candesartan] Shortness Of Breath  . Iodine Shortness Of Breath  . Demerol [Meperidine] Hives  . Ramipril Swelling    Paresthesias.  . Penicillins Rash   Previous Medications   ASPIRIN 81 MG TABLET    Take by mouth.   TRIAMTERENE-HYDROCHLOROTHIAZIDE (MAXZIDE) 75-50 MG PER TABLET    Take by mouth.    Review of Systems  Constitutional: Negative.   Respiratory: Negative.   Cardiovascular: Negative.  Negative for chest pain and palpitations.  Musculoskeletal: Negative.   Neurological: Positive for numbness. Negative for dizziness, tremors, syncope, facial asymmetry, speech difficulty, light-headedness and headaches.    Social History  Substance Use Topics  . Smoking status: Former Smoker -- 1.00 packs/day for 2 years    Quit date: 06/30/1959  . Smokeless tobacco: Never Used  . Alcohol Use: 4.2 oz/week    7 Glasses of wine per week     Comment: drinks wine with dinner   Objective:   BP 120/80 mmHg  Pulse 72  Temp(Src) 98.1 F (36.7 C) (Oral)  Resp 16  Wt 100 lb 6.4 oz (45.541 kg)  Physical  Exam  Constitutional: [name] oriented to person, place, and time. She appears well-developed and well-nourished.  Cardiovascular: Normal rate and regular rhythm.   Pulmonary/Chest: Effort normal and breath sounds normal.  Musculoskeletal: Normal range of motion.  Neurological: [name] alert and oriented to person, place, and time. No cranial nerve deficit. Coordination normal.  Psychiatric: She has a normal mood and affect. Her behavior is normal. Judgment and thought content normal.      Assessment & Plan:     1. Transient cerebral ischemia, unspecified transient cerebral ischemia type Will check some labs. CT scan with no acute changes. Will continue ASA. ER if any neurologic symptoms.   - Sed Rate (ESR)  2. Essential hypertension Stable. Continue current medication and plan of care.   - CBC with Differential/Platelet - TSH  3. Hypercholesteremia Will check labs.   - Lipid panel - Comprehensive metabolic panel     Margarita Rana, MD  [facility]

## 2015-05-23 DIAGNOSIS — G459 Transient cerebral ischemic attack, unspecified: Secondary | ICD-10-CM | POA: Diagnosis not present

## 2015-05-23 DIAGNOSIS — E7800 Pure hypercholesterolemia, unspecified: Secondary | ICD-10-CM | POA: Diagnosis not present

## 2015-05-23 DIAGNOSIS — I10 Essential (primary) hypertension: Secondary | ICD-10-CM | POA: Diagnosis not present

## 2015-05-24 LAB — CBC WITH DIFFERENTIAL/PLATELET
BASOS ABS: 0 10*3/uL (ref 0.0–0.2)
BASOS: 0 %
EOS (ABSOLUTE): 0.1 10*3/uL (ref 0.0–0.4)
Eos: 2 %
Hematocrit: 39.8 % (ref 34.0–46.6)
Hemoglobin: 13.6 g/dL (ref 11.1–15.9)
IMMATURE GRANS (ABS): 0 10*3/uL (ref 0.0–0.1)
IMMATURE GRANULOCYTES: 0 %
LYMPHS: 37 %
Lymphocytes Absolute: 1.7 10*3/uL (ref 0.7–3.1)
MCH: 31.4 pg (ref 26.6–33.0)
MCHC: 34.2 g/dL (ref 31.5–35.7)
MCV: 92 fL (ref 79–97)
Monocytes Absolute: 0.6 10*3/uL (ref 0.1–0.9)
Monocytes: 14 %
NEUTROS PCT: 47 %
Neutrophils Absolute: 2.1 10*3/uL (ref 1.4–7.0)
PLATELETS: 351 10*3/uL (ref 150–379)
RBC: 4.33 x10E6/uL (ref 3.77–5.28)
RDW: 13.7 % (ref 12.3–15.4)
WBC: 4.5 10*3/uL (ref 3.4–10.8)

## 2015-05-24 LAB — SEDIMENTATION RATE: Sed Rate: 2 mm/hr (ref 0–40)

## 2015-05-24 LAB — COMPREHENSIVE METABOLIC PANEL
A/G RATIO: 1.8 (ref 1.1–2.5)
ALT: 18 IU/L (ref 0–32)
AST: 24 IU/L (ref 0–40)
Albumin: 4.5 g/dL (ref 3.5–4.8)
Alkaline Phosphatase: 47 IU/L (ref 39–117)
BILIRUBIN TOTAL: 0.5 mg/dL (ref 0.0–1.2)
BUN/Creatinine Ratio: 25 (ref 11–26)
BUN: 18 mg/dL (ref 8–27)
CO2: 27 mmol/L (ref 18–29)
Calcium: 10.1 mg/dL (ref 8.7–10.3)
Chloride: 100 mmol/L (ref 97–106)
Creatinine, Ser: 0.72 mg/dL (ref 0.57–1.00)
GFR calc Af Amer: 95 mL/min/{1.73_m2} (ref >59)
GFR calc non Af Amer: 82 mL/min/{1.73_m2} (ref >59)
GLOBULIN, TOTAL: 2.5 g/dL (ref 1.5–4.5)
Glucose: 87 mg/dL (ref 65–99)
POTASSIUM: 5.4 mmol/L — AB (ref 3.5–5.2)
SODIUM: 141 mmol/L (ref 136–144)
Total Protein: 7 g/dL (ref 6.0–8.5)

## 2015-05-24 LAB — LIPID PANEL
CHOLESTEROL TOTAL: 243 mg/dL — AB (ref 100–199)
Chol/HDL Ratio: 2.1 ratio units (ref 0.0–4.4)
HDL: 115 mg/dL (ref >39)
LDL Calculated: 119 mg/dL — ABNORMAL HIGH (ref 0–99)
Triglycerides: 45 mg/dL (ref 0–149)
VLDL CHOLESTEROL CAL: 9 mg/dL (ref 5–40)

## 2015-05-24 LAB — TSH: TSH: 1.73 u[IU]/mL (ref 0.450–4.500)

## 2015-05-28 ENCOUNTER — Ambulatory Visit (INDEPENDENT_AMBULATORY_CARE_PROVIDER_SITE_OTHER): Payer: MEDICARE | Admitting: Family Medicine

## 2015-05-28 ENCOUNTER — Encounter: Payer: Self-pay | Admitting: Family Medicine

## 2015-05-28 VITALS — BP 126/78 | HR 68 | Temp 97.8°F | Resp 16 | Ht 61.0 in | Wt 100.0 lb

## 2015-05-28 DIAGNOSIS — G4730 Sleep apnea, unspecified: Secondary | ICD-10-CM

## 2015-05-28 DIAGNOSIS — Z0000 Encounter for general adult medical examination without abnormal findings: Secondary | ICD-10-CM | POA: Diagnosis not present

## 2015-05-28 DIAGNOSIS — I10 Essential (primary) hypertension: Secondary | ICD-10-CM

## 2015-05-28 MED ORDER — TRIAMTERENE-HCTZ 75-50 MG PO TABS: 1.0000 | ORAL_TABLET | Freq: Every day | ORAL | Status: DC

## 2015-05-28 NOTE — Progress Notes (Signed)
Patient ID: Mariah Levy, female   DOB: May 05, 1941, 74 y.o.   MRN: [id number]        Patient: Mariah Levy, Female    DOB: 03/24/1941, 74 y.o.   MRN: [id number] Visit Date: 05/28/2015  Today's Provider: Margarita Rana, MD   Chief Complaint  Patient presents with  . Medicare Wellness   Subjective:    Annual wellness visit Mariah Levy is a 74 y.o. female. She feels well. She reports exercising daily active with daily activities. She reports she is sleeping fairly well, 4-6 hours. Has dopplers pending.  Will decide on treatment for lipids after that.   Also, does not want flu shot or mammogram.   05/01/08 CPE 09/29/02 Colon-internal hemorrhoids 04/18/04 BMD  Lab Results  Component Value Date   WBC 4.5 05/23/2015   HCT 39.8 05/23/2015   GLUCOSE 87 05/23/2015   CHOL 243* 05/23/2015   TRIG 45 05/23/2015   HDL 115 05/23/2015   LDLCALC 119* 05/23/2015   ALT 18 05/23/2015   AST 24 05/23/2015   NA 141 05/23/2015   K 5.4* 05/23/2015   CL 100 05/23/2015   CREATININE 0.72 05/23/2015   BUN 18 05/23/2015   CO2 27 05/23/2015   TSH 1.730 05/23/2015    -----------------------------------------------------------   Review of Systems  Constitutional: Negative.   HENT: Positive for hearing loss.   Eyes: Negative.   Respiratory: Positive for apnea.   Cardiovascular: Negative.   Gastrointestinal: Negative.   Endocrine: Negative.   Genitourinary: Negative.   Musculoskeletal: Negative.   Skin: Negative.   Allergic/Immunologic: Negative.   Neurological: Positive for dizziness.  Hematological: Negative.   Psychiatric/Behavioral: Negative.     Social History   Social History  . Marital Status: Married    Spouse Name: N/A  . Number of Children: 3  . Years of Education: H/S   Occupational History  . Retired    Social History Main Topics  . Smoking status: Former Smoker -- 1.00 packs/day for 2 years    Quit date: 06/30/1959  . Smokeless tobacco: Never Used  . Alcohol  Use: 4.2 oz/week    7 Glasses of wine per week     Comment: drinks wine with dinner  . Drug Use: No  . Sexual Activity: Not on file   Other Topics Concern  . Not on file   Social History Narrative    Patient Active Problem List   Diagnosis Date Noted  . Transient ischemic attack 05/22/2015  . Hypercholesteremia 01/15/2015  . BP (high blood pressure) 01/15/2015  . Polyp of nasal sinus 01/15/2015  . Apnea, sleep 01/15/2015  . Abdominal pain 01/15/2015    Past Surgical History  Procedure Laterality Date  . Breast surgery Right 1985    lumpectomy x 2  . Breast surgery Right 1996    milk duct removal  . Abdominal hysterectomy  2006  . Dilation and curettage of uterus    . Appendectomy    . Hemorroidectomy    . Eye surgery Bilateral     cataract/ glaucoma    Her family history includes Aneurysm in her father; Arthritis in her mother; COPD in her brother; Cancer in her brother, brother, and father; Coronary artery disease in her brother; Diabetes in her brother, maternal grandmother, and son; Early death in her brother; Hypertension in her brother and mother; Pneumonia in her brother; Stroke in her mother.    Previous Medications   ASPIRIN 81 MG TABLET    Take by mouth.  TRIAMTERENE-HYDROCHLOROTHIAZIDE (MAXZIDE) 75-50 MG PER TABLET    Take by mouth.    Patient Care Team: Margarita Rana, MD as PCP - General (Family Medicine)     Objective:   Vitals: BP 126/78 mmHg  Pulse 68  Temp(Src) 97.8 F (36.6 C) (Oral)  Resp 16  Ht 5\' 1"  (1.549 m)  Wt 100 lb (45.36 kg)  BMI 18.90 kg/m2  SpO2 98%  Physical Exam  Constitutional: [name] oriented to person, place, and time. She appears well-developed and well-nourished.  HENT:  Head: Normocephalic and atraumatic.  Right Ear: Tympanic membrane, external ear and ear canal normal.  Left Ear: Tympanic membrane, external ear and ear canal normal.  Nose: Nose normal.  Mouth/Throat: Uvula is midline, oropharynx is clear and moist  and mucous membranes are normal.  Eyes: Conjunctivae, EOM and lids are normal. Pupils are equal, round, and reactive to light.  Neck: Trachea normal and normal range of motion. Neck supple. Carotid bruit is not present. No thyroid mass and no thyromegaly present.  Cardiovascular: Normal rate, regular rhythm and normal heart sounds.   Pulmonary/Chest: Effort normal and breath sounds normal.  Abdominal: Soft. Normal appearance and bowel sounds are normal. There is no hepatosplenomegaly. There is no tenderness.  Genitourinary: No breast swelling, tenderness or discharge.  Musculoskeletal: Normal range of motion.  Lymphadenopathy:    She has no cervical adenopathy.    She has no axillary adenopathy.  Neurological: [name] alert and oriented to person, place, and time. She has normal strength. No cranial nerve deficit.  Skin: Skin is warm, dry and intact.  Psychiatric: She has a normal mood and affect. Her speech is normal and behavior is normal. Judgment and thought content normal. Cognition and memory are normal.    Activities of Daily Living In your present state of health, do you have any difficulty performing the following activities: 05/28/2015  Hearing? Y  Vision? N  Difficulty concentrating or making decisions? N  Walking or climbing stairs? N  Dressing or bathing? N  Doing errands, shopping? N    Fall Risk Assessment Fall Risk  05/28/2015  Falls in the past year? Yes  Number falls in past yr: 1  Injury with Fall? No     Depression Screen PHQ 2/9 Scores 05/28/2015  PHQ - 2 Score 0    Cognitive Testing - 6-CIT  Correct? Score   What year is it? yes 0 0 or 4  What month is it? yes 0 0 or 3  Memorize:    Mariah Levy,  41,  [address]  [place],      What time is it? (within 1 hour) yes 0 0 or 3  Count backwards from 20 yes 0 0, 2, or 4  Name the months of the year yes 0 0, 2, or 4  Repeat name & address above yes 0 0, 2, 4, 6, 8, or 10       TOTAL SCORE  0/28     Interpretation:  Normal  Normal (0-7) Abnormal (8-28)       Assessment & Plan:     Annual Wellness Visit  Reviewed patient's Family Medical History Reviewed and updated list of patient's medical providers Assessment of cognitive impairment was done Assessed patient's functional ability Established a written schedule for health screening [place] Completed and Reviewed  Exercise Activities and Dietary recommendations Goals    . Exercise 150 minutes per week (moderate activity)       Immunization  History  Administered Date(s) Administered  . Tdap 04/18/2011         1. Medicare annual wellness visit, subsequent Stable. Patient advised to continue eating healthy and exercise daily. Patient declined mammogram and flu vaccine.  2. Apnea, sleep Patient could not tolerate CPAP.  Will check overnight oximetry to make sure does not need oxygen at night.   - Pulse oximetry, overnight; Future  3. Essential hypertension Stable. - triamterene-hydrochlorothiazide (MAXZIDE) 75-50 MG tablet; Take 1 tablet by mouth daily.  Dispense: 90 tablet; Refill: 2 - Comprehensive metabolic panel  Patient was seen and examined by Jerrell Belfast, MD, and note scribed by Lynford Humphrey, [place].   I have reviewed the document for accuracy and completeness and I agree with above. Jerrell Belfast, MD   Margarita Rana, MD   ------------------------------------------------------------------------------------------------------------

## 2015-05-29 ENCOUNTER — Ambulatory Visit
Admission: RE | Admit: 2015-05-29 | Discharge: 2015-05-29 | Disposition: A | Discharge status: Home / Self Care | Payer: MEDICARE | Source: Ambulatory Visit | Attending: Family Medicine | Admitting: Family Medicine

## 2015-05-29 ENCOUNTER — Telehealth: Payer: Self-pay

## 2015-05-29 DIAGNOSIS — R531 Weakness: Secondary | ICD-10-CM | POA: Insufficient documentation

## 2015-05-29 DIAGNOSIS — I6523 Occlusion and stenosis of bilateral carotid arteries: Secondary | ICD-10-CM | POA: Insufficient documentation

## 2015-05-29 DIAGNOSIS — R42 Dizziness and giddiness: Secondary | ICD-10-CM | POA: Insufficient documentation

## 2015-05-29 DIAGNOSIS — G459 Transient cerebral ischemic attack, unspecified: Secondary | ICD-10-CM

## 2015-05-29 LAB — COMPREHENSIVE METABOLIC PANEL
A/G RATIO: 2 (ref 1.1–2.5)
ALK PHOS: 51 IU/L (ref 39–117)
ALT: 18 IU/L (ref 0–32)
AST: 23 IU/L (ref 0–40)
Albumin: 4.7 g/dL (ref 3.5–4.8)
BUN/Creatinine Ratio: 23 (ref 11–26)
BUN: 18 mg/dL (ref 8–27)
Bilirubin Total: 0.3 mg/dL (ref 0.0–1.2)
CO2: 25 mmol/L (ref 18–29)
Calcium: 10.1 mg/dL (ref 8.7–10.3)
Chloride: 98 mmol/L (ref 97–106)
Creatinine, Ser: 0.77 mg/dL (ref 0.57–1.00)
GFR calc Af Amer: 87 mL/min/{1.73_m2} (ref >59)
GFR calc non Af Amer: 76 mL/min/{1.73_m2} (ref >59)
GLOBULIN, TOTAL: 2.4 g/dL (ref 1.5–4.5)
Glucose: 88 mg/dL (ref 65–99)
POTASSIUM: 5.2 mmol/L (ref 3.5–5.2)
SODIUM: 142 mmol/L (ref 136–144)
Total Protein: 7.1 g/dL (ref 6.0–8.5)

## 2015-05-29 NOTE — Telephone Encounter (Signed)
Tried calling; no answer.  05/29/2015    Thanks,  -Mickel Baas

## 2015-05-29 NOTE — Telephone Encounter (Signed)
-----   Message from Margarita Rana, MD sent at 05/29/2015 10:22 AM EST ----- Potassium slightly better. Make sure to avoid any potassium supplements or salt substitutes. Recheck in 3 months. Thanks.

## 2015-05-29 NOTE — Telephone Encounter (Signed)
Pt returning call.  [id number]

## 2015-05-30 ENCOUNTER — Telehealth: Payer: Self-pay

## 2015-05-30 NOTE — Telephone Encounter (Signed)
Pt advised as directed below.  She does not what to start a cholesterol medication right now.  She wants to look into a "Natural way" to reduce her cholesterol.   Thanks,   -Mickel Baas

## 2015-05-30 NOTE — Telephone Encounter (Signed)
-----   Message from Margarita Rana, MD sent at 05/29/2015  6:50 PM EST ----- Mild stenosis. Not enough to cause any symptoms, but may be enough to recommend starting cholesterol medication. Please see if would be willing to start medication. Thanks.

## 2015-05-30 NOTE — Telephone Encounter (Signed)
Pt advised as directed below.  [name] going to call in three months to get her lab sheet.   Thanks,   -Mickel Baas

## 2015-05-30 NOTE — Telephone Encounter (Signed)
Tried calling patient back, no answer. Will try again later.

## 2015-05-30 NOTE — Telephone Encounter (Signed)
Pt returned your call.  Please call her back.  Thanks Con Memos

## 2015-06-03 ENCOUNTER — Emergency Department
Admission: EM | Admit: 2015-06-03 | Discharge: 2015-06-03 | Disposition: A | Discharge status: Home / Self Care | Payer: MEDICARE | Attending: Emergency Medicine | Admitting: Emergency Medicine

## 2015-06-03 ENCOUNTER — Encounter: Payer: Self-pay | Admitting: Emergency Medicine

## 2015-06-03 DIAGNOSIS — Z87891 Personal history of nicotine dependence: Secondary | ICD-10-CM | POA: Insufficient documentation

## 2015-06-03 DIAGNOSIS — Z7982 Long term (current) use of aspirin: Secondary | ICD-10-CM | POA: Diagnosis not present

## 2015-06-03 DIAGNOSIS — R531 Weakness: Secondary | ICD-10-CM | POA: Diagnosis not present

## 2015-06-03 DIAGNOSIS — F419 Anxiety disorder, unspecified: Secondary | ICD-10-CM | POA: Diagnosis not present

## 2015-06-03 DIAGNOSIS — M6281 Muscle weakness (generalized): Secondary | ICD-10-CM | POA: Diagnosis not present

## 2015-06-03 DIAGNOSIS — I10 Essential (primary) hypertension: Secondary | ICD-10-CM | POA: Diagnosis not present

## 2015-06-03 DIAGNOSIS — R42 Dizziness and giddiness: Secondary | ICD-10-CM | POA: Insufficient documentation

## 2015-06-03 DIAGNOSIS — Z880 Allergy status to penicillin: Secondary | ICD-10-CM | POA: Insufficient documentation

## 2015-06-03 DIAGNOSIS — Z79899 Other long term (current) drug therapy: Secondary | ICD-10-CM | POA: Diagnosis not present

## 2015-06-03 LAB — CBC WITH DIFFERENTIAL/PLATELET
Basophils Absolute: 0 10*3/uL (ref 0–0.1)
Basophils Relative: 1 %
EOS ABS: 0.1 10*3/uL (ref 0–0.7)
Eosinophils Relative: 1 %
HCT: 40.1 % (ref 35.0–47.0)
HEMOGLOBIN: 13.6 g/dL (ref 12.0–16.0)
LYMPHS ABS: 1.8 10*3/uL (ref 1.0–3.6)
LYMPHS PCT: 30 %
MCH: 31.8 pg (ref 26.0–34.0)
MCHC: 33.9 g/dL (ref 32.0–36.0)
MCV: 93.9 fL (ref 80.0–100.0)
MONOS PCT: 12 %
Monocytes Absolute: 0.7 10*3/uL (ref 0.2–0.9)
NEUTROS PCT: 56 %
Neutro Abs: 3.4 10*3/uL (ref 1.4–6.5)
PLATELETS: 275 10*3/uL (ref 150–440)
RBC: 4.27 MIL/uL (ref 3.80–5.20)
RDW: 13.1 % (ref 11.5–14.5)
WBC: 6 10*3/uL (ref 3.6–11.0)

## 2015-06-03 LAB — BASIC METABOLIC PANEL
Anion gap: 11 (ref 5–15)
BUN: 18 mg/dL (ref 6–20)
CHLORIDE: 100 mmol/L — AB (ref 101–111)
CO2: 27 mmol/L (ref 22–32)
CREATININE: 0.68 mg/dL (ref 0.44–1.00)
Calcium: 9.2 mg/dL (ref 8.9–10.3)
GFR calc Af Amer: >60 mL/min (ref >60)
GFR calc non Af Amer: >60 mL/min (ref >60)
Glucose, Bld: 101 mg/dL — ABNORMAL HIGH (ref 65–99)
Potassium: 3.2 mmol/L — ABNORMAL LOW (ref 3.5–5.1)
SODIUM: 138 mmol/L (ref 135–145)

## 2015-06-03 MED ORDER — PROMETHAZINE HCL 25 MG/ML IJ SOLN
6.2500 mg | Freq: Once | INTRAMUSCULAR | Status: AC
  Administered 2015-06-03: 6.25 mg via INTRAVENOUS
  Filled 2015-06-03: qty 1

## 2015-06-03 MED ORDER — MECLIZINE HCL 25 MG PO TABS
12.5000 mg | ORAL_TABLET | Freq: Once | ORAL | Status: AC
  Administered 2015-06-03: 12.5 mg via ORAL
  Filled 2015-06-03: qty 1

## 2015-06-03 MED ORDER — MECLIZINE HCL 12.5 MG PO TABS: 12.5000 mg | ORAL_TABLET | Freq: Three times a day (TID) | ORAL | Status: DC | PRN

## 2015-06-03 NOTE — Discharge Instructions (Signed)
Take meclizine as needed for your vertigo. Follow-up with Dr. Venia Minks in the office. Discuss with her obtaining an MRI as an outpatient. Return to the emergency department if your symptoms worsen, if you have focal weakness, or feel other urgent concerns.  Vertigo Vertigo means that you feel like you are moving when you are not. Vertigo can also make you feel like things around you are moving when they are not. This feeling can come and go at any time. Vertigo often goes away on its own. HOME CARE  Avoid making fast movements.  Avoid driving.  Avoid using heavy machinery.  Avoid doing any task or activity that might cause danger to you or other people if you would have a vertigo attack while you are doing it.  Sit down right away if you feel dizzy or have trouble with your balance.  Take over-the-counter and prescription medicines only as told by your doctor.  Follow instructions from your doctor about which positions or movements you should avoid.  Drink enough fluid to keep your pee (urine) clear or pale yellow.  Keep all follow-up visits as told by your doctor. This is important. GET HELP IF:  Medicine does not help your vertigo.  You have a fever.  Your problems get worse or you have new symptoms.  Your family or friends see changes in your behavior.  You feel sick to your stomach (nauseous) or you throw up (vomit).  You have a "pins and needles" feeling or you are numb in part of your body. GET HELP RIGHT AWAY IF:  You have trouble moving or talking.  You are always dizzy.  You pass out (faint).  You get very bad headaches.  You feel weak or have trouble using your hands, arms, or legs.  You have changes in your hearing.  You have changes in your seeing (vision).  You get a stiff neck.  Bright light starts to bother you.   This information is not intended to replace advice given to you by your health care provider. Make sure you discuss any questions  you have with your health care provider.   Document Released: 03/25/2008 Document Revised: 03/07/2015 Document Reviewed: 10/09/2014 Elsevier Interactive Patient Education [employer].

## 2015-06-03 NOTE — ED Notes (Signed)
AAOx3.  Skin warm and dry.  Moving all extremities equally and strong.  Still complaining of dizziness, worse when standing, but states symptoms are much improved from when she arrived.  Reviewed prescribed medications, patient and family demonstrate good understanding of discharge teaching.

## 2015-06-03 NOTE — ED Notes (Signed)
Pt arrived via EMS for complaints of sudden onset weakness and dizziness. EMS reports CBG 110, 184/92. Pt reports feels numb on left side of body. Pt states was diagnosed TIA two weeks ago.

## 2015-06-03 NOTE — ED Provider Notes (Signed)
[facility] Emergency Department Provider Note  ____________________________________________  Time seen: On arrival at 1335  I have reviewed the triage vital signs and the nursing notes.  History by:  Patient  HISTORY  Chief Complaint Dizziness and Weakness     HPI WINDEE BURGDORF is a 74 y.o. female who reports she started feeling dizzy, spinning, off balance, earlier today. She denies any loss of function to hands or feet. She denies having a headache. She does have some mild nausea. She appears quite anxious. EMS had called and with possible stroke and she appears to be quite concerned about this possibility.  The patient had similar symptoms approximately 2 weeks ago. The symptoms had resolved and she saw a mid-level provider at her primary care doctor's office. She definitely had a CT of the head. Results of that have been reviewed (no acute changes). She was told she had TIAs. With that episode, she also did not have any loss of function or sensation, but all other dizziness in the middle of the night.  The patient denies any chest pain or shortness of breath.    Past Medical History  Diagnosis Date  . Hypertension     Patient Active Problem List   Diagnosis Date Noted  . Transient ischemic attack 05/22/2015  . Hypercholesteremia 01/15/2015  . BP (high blood pressure) 01/15/2015  . Polyp of nasal sinus 01/15/2015  . Apnea, sleep 01/15/2015  . Abdominal pain 01/15/2015    Past Surgical History  Procedure Laterality Date  . Breast surgery Right 1985    lumpectomy x 2  . Breast surgery Right 1996    milk duct removal  . Abdominal hysterectomy  2006  . Dilation and curettage of uterus    . Appendectomy    . Hemorroidectomy    . Eye surgery Bilateral     cataract/ glaucoma    Current Outpatient Rx  Name  Route  Sig  Dispense  Refill  . aspirin 81 MG tablet   Oral   Take by mouth.         . meclizine (ANTIVERT) 12.5 MG tablet  Oral   Take 1 tablet (12.5 mg total) by mouth 3 (three) times daily as needed for dizziness.   20 tablet   0   . triamterene-hydrochlorothiazide (MAXZIDE) 75-50 MG tablet   Oral   Take 1 tablet by mouth daily.   90 tablet   2     Allergies Atacand ; Iodine; Demerol; Ramipril; and Penicillins  Family History  Problem Relation Age of Onset  . Hypertension Mother   . Arthritis Mother   . Stroke Mother   . Cancer Father   . Aneurysm Father   . Hypertension Brother   . Diabetes Brother   . Coronary artery disease Brother   . Cancer Brother   . COPD Brother   . Cancer Brother     lung  . Early death Brother     Died as infant  . Pneumonia Brother   . Diabetes Son   . Diabetes Maternal Grandmother     Social History Social History  Substance Use Topics  . Smoking status: Former Smoker -- 1.00 packs/day for 2 years    Quit date: 06/30/1959  . Smokeless tobacco: Never Used  . Alcohol Use: 4.2 oz/week    7 Glasses of wine per week     Comment: drinks wine with dinner    Review of Systems  Constitutional: Negative for fever/chills. ENT: Negative  for congestion. Cardiovascular: Negative for chest pain. Respiratory: Negative for cough. Gastrointestinal: Negative for abdominal pain, vomiting and diarrhea. Genitourinary: Negative for dysuria. Musculoskeletal: No back pain. Skin: Negative for rash. Neurological: Positive for vertigo. See history of present illness   10-point ROS otherwise negative.  ____________________________________________   PHYSICAL EXAM:  VITAL SIGNS: ED Triage Vitals  Enc Vitals Group     BP --      Pulse Rate 06/03/15 1338 78     Resp 06/03/15 1338 18     Temp 06/03/15 1338 97.8 F (36.6 C)     Temp src --      SpO2 06/03/15 1338 100 %     Weight 06/03/15 1338 97 lb (43.999 kg)     Height 06/03/15 1338 5\' 1"  (1.549 m)     Head Cir --      Peak Flow --      Pain Score --      Pain Loc --      Pain Edu? --      Excl. in [place]?  --     Constitutional:  Alert and oriented. Appears nervous, anxious, but otherwise no acute distress. Alert and communicative. Patient becomes much more comfortable after we discussed her differential diagnosis and the strong possibility that this is not a stroke. ENT   Head: Normocephalic and atraumatic.   Nose: No congestion/rhinnorhea.       Mouth: No erythema, no swelling   Cardiovascular: Normal rate, regular rhythm, no murmur noted Respiratory:  Normal respiratory effort, no tachypnea.    Breath sounds are clear and equal bilaterally.  Gastrointestinal: Soft, no distention. Nontender Back: No muscle spasm, no tenderness, no CVA tenderness. Musculoskeletal: No deformity noted. Nontender with normal range of motion in all extremities.  No noted edema. Neurologic:  Communicative. Equal strength bilaterally. 5 over 5 strength in all 4 extremities. Good finger to nose coordination. Negative pronator drift and negative Romberg. Sensation intact throughout. Cranial alert 2 through 12 are intact. No gross focal neurologic deficits are appreciated.  Skin:  Skin is warm, dry. No rash noted. Psychiatric: Nervous, anxious, but communicative with an intact thought process. ____________________________________________    LABS (pertinent positives/negatives)  Labs Reviewed  BASIC METABOLIC PANEL - Abnormal; Notable for the following:    Potassium 3.2 (*)    Chloride 100 (*)    Glucose, Bld 101 (*)    All other components within normal limits  CBC WITH DIFFERENTIAL/PLATELET  URINALYSIS COMPLETEWITH MICROSCOPIC ([facility] ONLY)   ___________________________________________   INITIAL IMPRESSION / ASSESSMENT AND PLAN / ED COURSE  Pertinent labs & imaging results that were available during my care of the patient were reviewed by me and considered in my medical decision making (see chart for details).  74 year old female with signs and symptoms of vertigo. She had similar symptoms about 2  weeks ago. She does not have any focal neurologic deficit except for the subjective vertigo. She had a head CT performed recently that showed no acute changes. With this recent CT preceded by similar symptoms, we will not duplicate the study today. We will treat her with Phenergan, 6.25 mg IV, and meclizine, 12.5 mg by mouth, and assess for improvement.  ----------------------------------------- 3:17 PM on 06/03/2015 -----------------------------------------  Patient's blood tests overall look good. Her hemoglobin is 13.6. Her potassium is little bit low at 3.2. We will give her 20 mEq of potassium to help begin replacement on this.  At this time, on reexamination, the patient does appear to  be better. She can sit up, but she does report she then has a little bit of dizziness.  Further discussion and history with the family, the son reports that the patient had some weakness in her left leg when he assessed her at home, but that weakness then disappeared when he reassessed her. Here, as noted above, she had no focal neurologic deficit.  With this question of weakness in her left leg that was brief, and incomplete resolution of her symptoms, I am calling the neurologist on-call to discuss the case and determine whether she would meet criteria for an urgent MRI.   ----------------------------------------- 3:36 PM on 06/03/2015 -----------------------------------------  I discussed the case with Dr. Irish Elders. Given the patient's improvement and the history, he is comfortable having the patient go home. She can follow-up with her primary physician and have an outpatient MRI if indicated.  I will continue her on meclizine. Patient and family agree with this plan. I have placed a call to [facility] family practice to attempt to relay this information to them. They're callback is pending.  ____________________________________________   FINAL CLINICAL IMPRESSION(S) / ED DIAGNOSES  Final  diagnoses:  Vertigo      Ahmed Prima, MD 06/03/15 [phone]

## 2015-06-04 ENCOUNTER — Telehealth: Payer: Self-pay | Admitting: Family Medicine

## 2015-06-04 NOTE — Telephone Encounter (Signed)
Spoke with pt and scheduled appt for 06/05/15 at 10:45. Thanks TNP

## 2015-06-04 NOTE — Telephone Encounter (Signed)
Ok to work in [employer].  Thanks.

## 2015-06-04 NOTE — Telephone Encounter (Signed)
Pt stated that she went to the ER on 06/03/15 and was told that she needs to see Dr. Venia Minks Tuesday. Can pt worked in for Tuesday? Thanks TNP

## 2015-06-05 ENCOUNTER — Encounter: Payer: Self-pay | Admitting: Family Medicine

## 2015-06-05 ENCOUNTER — Ambulatory Visit (INDEPENDENT_AMBULATORY_CARE_PROVIDER_SITE_OTHER): Payer: MEDICARE | Admitting: Family Medicine

## 2015-06-05 VITALS — BP 112/64 | HR 88 | Temp 98.3°F | Resp 16 | Wt 99.8 lb

## 2015-06-05 DIAGNOSIS — R42 Dizziness and giddiness: Secondary | ICD-10-CM

## 2015-06-05 DIAGNOSIS — H8110 Benign paroxysmal vertigo, unspecified ear: Secondary | ICD-10-CM | POA: Insufficient documentation

## 2015-06-05 DIAGNOSIS — H6123 Impacted cerumen, bilateral: Secondary | ICD-10-CM | POA: Diagnosis not present

## 2015-06-05 DIAGNOSIS — G459 Transient cerebral ischemic attack, unspecified: Secondary | ICD-10-CM

## 2015-06-05 DIAGNOSIS — H6120 Impacted cerumen, unspecified ear: Secondary | ICD-10-CM | POA: Insufficient documentation

## 2015-06-05 NOTE — Progress Notes (Signed)
Subjective:     Patient ID: Mariah Levy, female   DOB: January 18, 1941, 74 y.o.   MRN: [id number]  Chief Complaint  Patient presents with  . Hospitalization Follow-up    HPI  Mariah Levy is a 74yo F with h/o vertigo and questionable TIA here for follow-up after ED visit for dizziness on 12/4. Earlier that day she felt off balance. After getting home from church, she started to feel dizzy, room spinning, very nauseated, falling on one side, generally "not feeling right". Felt better after resting sitting down. Severe room spinning when standing up. No weakness, numbness, tingling, though son felt L foot weaker than R. She was in the office 11/22 with similar sx including numbness L side of face. Carotid doppler 11/29 showed <50% stenosis. Did not want to start a cholesterol medcation.    She went to [facility] ED 12/4, received meclizine, and felt better after resting for a few hours. In the ED found was low potassium, which was repleted. Pt given diagnosis of vertigo, unlikely TIA. Dr. Thomasene Lot consulted neurologist Dr. Irish Elders who did not believe urgent MRI was needed, but to discuss outpatient MRI at f/u.  She has not taken meclizine since that time. Picked up the meclizine and will take it prn when feels nausea. Denies dizziness, nausea since ED visit.   She denies increased stress 12/4 prior to ED visit. Possible stress as contributory as her husband is aspirating and coughing with food, liquid, and refusing further testing and treatment. Pt believes this is partially due to miscommunication between health care providers and cancelled appointments. Her husband is coughing less,  though still having issues.   Review of Systems  Constitutional: Negative.   HENT: Negative.   Eyes: Negative.   Respiratory: Negative.   Cardiovascular: Negative.   Gastrointestinal: Positive for nausea (not currently). Negative for vomiting, diarrhea and constipation.  Endocrine: Negative.   Genitourinary:  Negative.   Musculoskeletal: Negative.   Skin: Negative.   Neurological: Positive for dizziness (not currently). Negative for facial asymmetry, weakness and numbness.  Psychiatric/Behavioral:       Nervousness     Patient Active Problem List   Diagnosis Date Noted  . Transient ischemic attack 05/22/2015  . Hypercholesteremia 01/15/2015  . BP (high blood pressure) 01/15/2015  . Polyp of nasal sinus 01/15/2015  . Apnea, sleep 01/15/2015  . Abdominal pain 01/15/2015   Previous Medications   ASPIRIN 81 MG TABLET    Take by mouth.   MECLIZINE (ANTIVERT) 12.5 MG TABLET    Take 1 tablet (12.5 mg total) by mouth 3 (three) times daily as needed for dizziness.   TRIAMTERENE-HYDROCHLOROTHIAZIDE (MAXZIDE) 75-50 MG TABLET    Take 1 tablet by mouth daily.   Allergies  Allergen Reactions  . Atacand  [Candesartan] Shortness Of Breath  . Iodine Shortness Of Breath  . Demerol [Meperidine] Hives  . Ramipril Swelling    Paresthesias.  . Penicillins Rash   Past Surgical History  Procedure Laterality Date  . Breast surgery Right 1985    lumpectomy x 2  . Breast surgery Right 1996    milk duct removal  . Abdominal hysterectomy  2006  . Dilation and curettage of uterus    . Appendectomy    . Hemorroidectomy    . Eye surgery Bilateral     cataract/ glaucoma   Family History  Problem Relation Age of Onset  . Hypertension Mother   . Arthritis Mother   . Stroke Mother   .  Cancer Father   . Aneurysm Father   . Hypertension Brother   . Diabetes Brother   . Coronary artery disease Brother   . Cancer Brother   . COPD Brother   . Cancer Brother     lung  . Early death Brother     Died as infant  . Pneumonia Brother   . Diabetes Son   . Diabetes Maternal Grandmother    Social History   Social History  . Marital Status: Married    Spouse Name: N/A  . Number of Children: 3  . Years of Education: H/S   Occupational History  . Retired    Social History Main Topics  . Smoking  status: Former Smoker -- 1.00 packs/day for 2 years    Quit date: 06/30/1959  . Smokeless tobacco: Never Used  . Alcohol Use: 4.2 oz/week    7 Glasses of wine per week     Comment: drinks wine with dinner  . Drug Use: No  . Sexual Activity: Not on file   Other Topics Concern  . Not on file   Social History Narrative  Spoke with pt extensively about how she is looking forward to Christmas. She has a great relationship with her husband, who prays with her every morning. She feels they are very close. He gets worried when she has health problems, which in turn makes her nervous. 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 She endorses feeling peace with the Lord's plans for her and her husband.     Objective:   Physical Exam  Constitutional: [name] oriented to person, place, and time. She appears well-developed and well-nourished. No distress.  HENT:  Head: Normocephalic and atraumatic.  Nose: Nose normal.  Mouth/Throat: Oropharynx is clear and moist. No oropharyngeal exudate.  Eyes: EOM are normal. Pupils are equal, round, and reactive to light. Right eye exhibits no discharge. Left eye exhibits no discharge.  Neck: Normal range of motion. Neck supple.  Cardiovascular: Normal rate and regular rhythm.  Exam reveals no gallop and no friction rub.   No murmur heard. Pulmonary/Chest: Effort normal and breath sounds normal. No respiratory distress. She has no wheezes. She exhibits no tenderness.  Abdominal: Soft. Bowel sounds are normal. She exhibits no distension. There is no tenderness.  Musculoskeletal: Normal range of motion. She exhibits no edema or tenderness.  Neurological: [name] alert and oriented to person, place, and time. She has normal reflexes. She displays normal reflexes. No cranial nerve deficit. She exhibits normal muscle tone.  EOMI, PERRL, facial sensation symmetric, facial movement symmetric, hearing intact, tongue midline, strength 5/5 in all  extremities, sensation to light touch intact in all extremities  Skin: Skin is warm and dry. [name] not diaphoretic. No erythema.  Psychiatric:  Nervous, slightly anxious, mood appropriate, affect normal    BP 112/64 mmHg  Pulse 88  Temp(Src) 98.3 F (36.8 C) (Oral)  Resp 16  Wt 99 lb 12.8 oz (45.269 kg)     Assessment:     Vertigo.     Plan:     1. Transient cerebral ischemia, unspecified transient cerebral ischemia type Will proceed with further work up if symptoms recur.  More likely,   Vertigo.   See below.  2. Vertigo Will refer to ENT.   3. Cerumen impaction, bilateral Improved after flushing today.    Margarita Rana, MD

## 2015-06-08 ENCOUNTER — Telehealth: Payer: Self-pay

## 2015-06-08 ENCOUNTER — Other Ambulatory Visit
Admission: RE | Admit: 2015-06-08 | Discharge: 2015-06-08 | Disposition: A | Discharge status: Home / Self Care | Payer: MEDICARE | Source: Ambulatory Visit | Attending: Family Medicine | Admitting: Family Medicine

## 2015-06-08 DIAGNOSIS — Z029 Encounter for administrative examinations, unspecified: Secondary | ICD-10-CM | POA: Insufficient documentation

## 2015-06-08 LAB — BASIC METABOLIC PANEL
Anion gap: 9 (ref 5–15)
BUN: 13 mg/dL (ref 6–20)
CHLORIDE: 98 mmol/L — AB (ref 101–111)
CO2: 31 mmol/L (ref 22–32)
Calcium: 9.5 mg/dL (ref 8.9–10.3)
Creatinine, Ser: 0.79 mg/dL (ref 0.44–1.00)
GFR calc Af Amer: >60 mL/min (ref >60)
GLUCOSE: 159 mg/dL — AB (ref 65–99)
POTASSIUM: 4.2 mmol/L (ref 3.5–5.1)
Sodium: 138 mmol/L (ref 135–145)

## 2015-06-08 NOTE — Telephone Encounter (Signed)
Tried calling; no answer.  06/08/2015  Thanks,   -Mickel Baas

## 2015-06-08 NOTE — Telephone Encounter (Signed)
-----   Message from Margarita Rana, MD sent at 06/08/2015 11:10 AM EST ----- Potassium normal.  Please notify patient. Thanks.

## 2015-06-12 NOTE — Telephone Encounter (Signed)
Pt advised of appointment to see Dr Tami Ribas 06-18-15

## 2015-06-12 NOTE — Telephone Encounter (Signed)
Pt advised as directed below.  She says she has not heard anything about her ENT referral yet.   Thanks,   -Mickel Baas

## 2015-06-13 DIAGNOSIS — R0602 Shortness of breath: Secondary | ICD-10-CM | POA: Diagnosis not present

## 2015-06-18 ENCOUNTER — Encounter: Payer: Self-pay | Admitting: Family Medicine

## 2015-06-18 DIAGNOSIS — R42 Dizziness and giddiness: Secondary | ICD-10-CM | POA: Diagnosis not present

## 2015-09-05 ENCOUNTER — Ambulatory Visit (INDEPENDENT_AMBULATORY_CARE_PROVIDER_SITE_OTHER): Payer: MEDICARE | Admitting: Family Medicine

## 2015-09-05 ENCOUNTER — Encounter: Payer: Self-pay | Admitting: Family Medicine

## 2015-09-05 VITALS — BP 120/72 | HR 76 | Temp 97.9°F | Resp 16 | Wt 102.0 lb

## 2015-09-05 DIAGNOSIS — G459 Transient cerebral ischemic attack, unspecified: Secondary | ICD-10-CM | POA: Diagnosis not present

## 2015-09-05 DIAGNOSIS — E7800 Pure hypercholesterolemia, unspecified: Secondary | ICD-10-CM | POA: Diagnosis not present

## 2015-09-05 DIAGNOSIS — H8110 Benign paroxysmal vertigo, unspecified ear: Secondary | ICD-10-CM

## 2015-09-05 NOTE — Patient Instructions (Signed)
Benign Positional Vertigo Vertigo is the feeling that you or your surroundings are moving when they are not. Benign positional vertigo is the most common form of vertigo. The cause of this condition is not serious (is benign). This condition is triggered by certain movements and positions (is positional). This condition can be dangerous if it occurs while you are doing something that could endanger you or others, such as driving.  CAUSES In many cases, the cause of this condition is not known. It may be caused by a disturbance in an area of the inner ear that helps your brain to sense movement and balance. This disturbance can be caused by a viral infection (labyrinthitis), head injury, or repetitive motion. RISK FACTORS This condition is more likely to develop in:  Women.  People who are 50 years of age or older. SYMPTOMS Symptoms of this condition usually happen when you move your head or your eyes in different directions. Symptoms may start suddenly, and they usually last for less than a minute. Symptoms may include:  Loss of balance and falling.  Feeling like you are spinning or moving.  Feeling like your surroundings are spinning or moving.  Nausea and vomiting.  Blurred vision.  Dizziness.  Involuntary eye movement (nystagmus). Symptoms can be mild and cause only slight annoyance, or they can be severe and interfere with daily life. Episodes of benign positional vertigo may return (recur) over time, and they may be triggered by certain movements. Symptoms may improve over time. DIAGNOSIS This condition is usually diagnosed by medical history and a physical exam of the head, neck, and ears. You may be referred to a health care provider who specializes in ear, nose, and throat (ENT) problems (otolaryngologist) or a provider who specializes in disorders of the nervous system (neurologist). You may have additional testing, including:  MRI.  A CT scan.  Eye movement tests. Your  health care provider may ask you to change positions quickly while he or she watches you for symptoms of benign positional vertigo, such as nystagmus. Eye movement may be tested with an electronystagmogram (ENG), caloric stimulation, the Dix-Hallpike test, or the roll test.  An electroencephalogram (EEG). This records electrical activity in your brain.  Hearing tests. TREATMENT Usually, your health care provider will treat this by moving your head in specific positions to adjust your inner ear back to normal. Surgery may be needed in severe cases, but this is rare. In some cases, benign positional vertigo may resolve on its own in 2-4 weeks. HOME CARE INSTRUCTIONS Safety  Move slowly.Avoid sudden body or head movements.  Avoid driving.  Avoid operating heavy machinery.  Avoid doing any tasks that would be dangerous to you or others if a vertigo episode would occur.  If you have trouble walking or keeping your balance, try using a cane for stability. If you feel dizzy or unstable, sit down right away.  Return to your normal activities as told by your health care provider. Ask your health care provider what activities are safe for you. General Instructions  Take over-the-counter and prescription medicines only as told by your health care provider.  Avoid certain positions or movements as told by your health care provider.  Drink enough fluid to keep your urine clear or pale yellow.  Keep all follow-up visits as told by your health care provider. This is important. SEEK MEDICAL CARE IF:  You have a fever.  Your condition gets worse or you develop new symptoms.  Your family or friends   notice any behavioral changes.  Your nausea or vomiting gets worse.  You have numbness or a "pins and needles" sensation. SEEK IMMEDIATE MEDICAL CARE IF:  You have difficulty speaking or moving.  You are always dizzy.  You faint.  You develop severe headaches.  You have weakness in your  legs or arms.  You have changes in your hearing or vision.  You develop a stiff neck.  You develop sensitivity to light.   This information is not intended to replace advice given to you by your health care provider. Make sure you discuss any questions you have with your health care provider.   Document Released: 03/24/2006 Document Revised: 03/07/2015 Document Reviewed: 10/09/2014 Elsevier Interactive Patient Education 2016 [employer]  

## 2015-09-05 NOTE — Progress Notes (Signed)
Subjective:    Patient ID: Mariah Levy, female    DOB: [birth year], 75 y.o.   MRN: [id number]  HPI   Follow up for Transient Cerebral Ischemia  The patient was last seen for this 3 months ago. Changes made at last visit include order US doppler (which showed mild stenosis), check labs (cholesterol was elevated), refer to ENT for vertigo. Was not happy with ENT referral. They told her she did not need big work up for vertigo.  Told her they could fix it in the office if it happens again.   She reports poor compliance with treatment. Pt declines starting Statin. She feels that condition is Improved. Pt states this is 80% improved.  Pt still experiencing left sided weakness intermittently, as well as intermittent dizziness.  Happens when she plays with her puppy and plays with her grandkids, and other movements.Has to get back up.  Does have some reduced strength.   Some facial drooping at times.   Feels this is no different from her first TIA many years ago. No new symptoms. Mostly feels well and is assymptomatic.   ------------------------------------------------------------------------------------     Review of Systems  Constitutional: Negative for fever, chills, diaphoresis, activity change, appetite change and fatigue.  Respiratory: Negative for apnea, choking and chest tightness.   Cardiovascular: Negative.   Neurological: Positive for dizziness, facial asymmetry, weakness, light-headedness and headaches (upon awakening). Negative for syncope.   BP 120/72 mmHg  Pulse 76  Temp(Src) 97.9 F (36.6 C) (Oral)  Resp 16  Wt 102 lb (46.267 kg)   Patient Active Problem List   Diagnosis Date Noted  . Vertigo 06/05/2015  . Cerumen impaction 06/05/2015  . Transient ischemic attack 05/22/2015  . Hypercholesteremia 01/15/2015  . BP (high blood pressure) 01/15/2015  . Polyp of nasal sinus 01/15/2015  . Apnea, sleep 01/15/2015  . Abdominal pain 01/15/2015   Past Medical History    Diagnosis Date  . Hypertension    Current Outpatient Prescriptions on File Prior to Visit  Medication Sig  . aspirin 81 MG tablet Take by mouth.  . meclizine (ANTIVERT) 12.5 MG tablet Take 1 tablet (12.5 mg total) by mouth 3 (three) times daily as needed for dizziness. (Patient not taking: Reported on 06/05/2015)  . triamterene-hydrochlorothiazide (MAXZIDE) 75-50 MG tablet Take 1 tablet by mouth daily.   No current facility-administered medications on file prior to visit.   Allergies  Allergen Reactions  . Atacand  [Candesartan] Shortness Of Breath  . Iodine Shortness Of Breath  . Demerol [Meperidine] Hives  . Propoxyphene Hives    Darvon, Darvocet  . Ramipril Swelling    Paresthesias.  . Penicillins Rash   Past Surgical History  Procedure Laterality Date  . Breast surgery Right 1985    lumpectomy x 2  . Breast surgery Right 1996    milk duct removal  . Abdominal hysterectomy  2006  . Dilation and curettage of uterus    . Appendectomy    . Hemorroidectomy    . Eye surgery Bilateral     cataract/ glaucoma   Social History   Social History  . Marital Status: Married    Spouse Name: N/A  . Number of Children: 3  . Years of Education: H/S   Occupational History  . Retired    Social History Main Topics  . Smoking status: Former Smoker -- 1.00 packs/day for 2 years    Quit date: 06/30/1959  . Smokeless tobacco: Never Used  . Alcohol Use: 4.2  oz/week    7 Glasses of wine per week     Comment: drinks wine with dinner  . Drug Use: No  . Sexual Activity: Not on file   Other Topics Concern  . Not on file   Social History Narrative   Family History  Problem Relation Age of Onset  . Hypertension Mother   . Arthritis Mother   . Stroke Mother   . Cancer Father   . Aneurysm Father   . Hypertension Brother   . Diabetes Brother   . Coronary artery disease Brother   . Cancer Brother   . COPD Brother   . Cancer Brother     lung  . Early death Brother     Died  as infant  . Pneumonia Brother   . Diabetes Son   . Diabetes Maternal Grandmother       Objective:   Physical Exam  Constitutional: [name] oriented to person, place, and time. She appears well-developed and well-nourished.  Cardiovascular: Normal rate and regular rhythm.   Pulmonary/Chest: Effort normal and breath sounds normal.  Musculoskeletal: Normal range of motion.  Neurological: [name] alert and oriented to person, place, and time.  Psychiatric: She has a normal mood and affect. Her behavior is normal. Judgment and thought content normal.   BP 120/72 mmHg  Pulse 76  Temp(Src) 97.9 F (36.6 C) (Oral)  Resp 16  Wt 102 lb (46.267 kg)     Assessment & Plan:  1. Hypercholesteremia Did not want to start a statin. Has been working on lifestyle changes. Would like to get it rechecked.  - Lipid panel  2. Transient cerebral ischemia, unspecified transient cerebral ischemia type Stable. No recurrent symptoms.    3. Benign paroxysmal positional vertigo, unspecified laterality Appears to be cause of some problems still, based on patient symptoms today. Gave handout on diagnosis  And some exercises for her to try. Referral to ENT if does not improve.   Margarita Rana, MD

## 2015-09-18 DIAGNOSIS — E7800 Pure hypercholesterolemia, unspecified: Secondary | ICD-10-CM | POA: Diagnosis not present

## 2015-09-19 ENCOUNTER — Telehealth: Payer: Self-pay

## 2015-09-19 LAB — LIPID PANEL
CHOL/HDL RATIO: 2 ratio (ref 0.0–4.4)
Cholesterol, Total: 243 mg/dL — ABNORMAL HIGH (ref 100–199)
HDL: 124 mg/dL (ref >39)
LDL Calculated: 111 mg/dL — ABNORMAL HIGH (ref 0–99)
Triglycerides: 39 mg/dL (ref 0–149)
VLDL CHOLESTEROL CAL: 8 mg/dL (ref 5–40)

## 2015-09-19 NOTE — Telephone Encounter (Signed)
Pt advised as directed below.  [name] going to work on lifestyle changes before starting a statin.  Thanks,   -Mickel Baas

## 2015-09-19 NOTE — Telephone Encounter (Signed)
-----   Message from Margarita Rana, MD sent at 09/19/2015  9:36 AM EDT ----- Cholesterol if elevated at 243. Does have very high good cholesterol.   In light of history of TIA, a statin would be recommended. Please see if patient wants a mediation or continue lifestyle changes. Thanks.

## 2015-09-19 NOTE — Telephone Encounter (Signed)
Tried calling; no answer 09/19/2015  Thanks,   -Mickel Baas

## 2016-03-05 ENCOUNTER — Other Ambulatory Visit: Payer: Self-pay | Admitting: Family Medicine

## 2016-03-05 DIAGNOSIS — I10 Essential (primary) hypertension: Secondary | ICD-10-CM

## 2016-03-06 ENCOUNTER — Other Ambulatory Visit: Payer: Self-pay | Admitting: Family Medicine

## 2016-03-06 DIAGNOSIS — I10 Essential (primary) hypertension: Secondary | ICD-10-CM

## 2016-03-06 MED ORDER — TRIAMTERENE-HCTZ 75-50 MG PO TABS: 1.0000 | ORAL_TABLET | Freq: Every day | ORAL | 2 refills | Status: DC

## 2016-07-02 ENCOUNTER — Encounter: Payer: Self-pay | Admitting: Family Medicine

## 2016-07-02 ENCOUNTER — Ambulatory Visit
Admission: RE | Admit: 2016-07-02 | Discharge: 2016-07-02 | Disposition: A | Discharge status: Home / Self Care | Payer: MEDICARE | Source: Ambulatory Visit | Attending: Family Medicine | Admitting: Family Medicine

## 2016-07-02 ENCOUNTER — Ambulatory Visit (INDEPENDENT_AMBULATORY_CARE_PROVIDER_SITE_OTHER): Payer: MEDICARE | Admitting: Family Medicine

## 2016-07-02 VITALS — BP 140/90 | HR 76 | Temp 98.2°F | Resp 16 | Wt 100.0 lb

## 2016-07-02 DIAGNOSIS — G4730 Sleep apnea, unspecified: Secondary | ICD-10-CM

## 2016-07-02 DIAGNOSIS — I10 Essential (primary) hypertension: Secondary | ICD-10-CM | POA: Diagnosis not present

## 2016-07-02 DIAGNOSIS — M47812 Spondylosis without myelopathy or radiculopathy, cervical region: Secondary | ICD-10-CM | POA: Diagnosis not present

## 2016-07-02 DIAGNOSIS — G43109 Migraine with aura, not intractable, without status migrainosus: Secondary | ICD-10-CM | POA: Insufficient documentation

## 2016-07-02 DIAGNOSIS — G8929 Other chronic pain: Secondary | ICD-10-CM

## 2016-07-02 DIAGNOSIS — R51 Headache: Secondary | ICD-10-CM | POA: Insufficient documentation

## 2016-07-02 DIAGNOSIS — E7800 Pure hypercholesterolemia, unspecified: Secondary | ICD-10-CM | POA: Diagnosis not present

## 2016-07-02 DIAGNOSIS — M542 Cervicalgia: Secondary | ICD-10-CM | POA: Diagnosis not present

## 2016-07-02 NOTE — Patient Instructions (Signed)
Please stop caffeine beverages. We will call you with the x-ray and lab results.
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
## 2016-07-03 ENCOUNTER — Encounter: Payer: Self-pay | Admitting: Family Medicine

## 2016-07-03 LAB — COMPREHENSIVE METABOLIC PANEL
A/G RATIO: 1.9 (ref 1.2–2.2)
ALBUMIN: 4.7 g/dL (ref 3.5–4.8)
ALT: 16 IU/L (ref 0–32)
AST: 23 IU/L (ref 0–40)
Alkaline Phosphatase: 48 IU/L (ref 39–117)
BILIRUBIN TOTAL: 0.5 mg/dL (ref 0.0–1.2)
BUN / CREAT RATIO: 19 (ref 12–28)
BUN: 17 mg/dL (ref 8–27)
CALCIUM: 9.8 mg/dL (ref 8.7–10.3)
CHLORIDE: 97 mmol/L (ref 96–106)
CO2: 25 mmol/L (ref 18–29)
Creatinine, Ser: 0.91 mg/dL (ref 0.57–1.00)
GFR, EST AFRICAN AMERICAN: 71 mL/min/{1.73_m2} (ref >59)
GFR, EST NON AFRICAN AMERICAN: 61 mL/min/{1.73_m2} (ref >59)
Globulin, Total: 2.5 g/dL (ref 1.5–4.5)
Glucose: 88 mg/dL (ref 65–99)
POTASSIUM: 3.6 mmol/L (ref 3.5–5.2)
Sodium: 138 mmol/L (ref 134–144)
TOTAL PROTEIN: 7.2 g/dL (ref 6.0–8.5)

## 2016-07-03 LAB — LIPID PANEL
CHOL/HDL RATIO: 2.4 ratio (ref 0.0–4.4)
Cholesterol, Total: 287 mg/dL — ABNORMAL HIGH (ref 100–199)
HDL: 120 mg/dL (ref >39)
LDL Calculated: 155 mg/dL — ABNORMAL HIGH (ref 0–99)
Triglycerides: 58 mg/dL (ref 0–149)
VLDL CHOLESTEROL CAL: 12 mg/dL (ref 5–40)

## 2016-07-17 ENCOUNTER — Ambulatory Visit: Payer: MEDICARE | Admitting: Family Medicine

## 2016-07-18 ENCOUNTER — Ambulatory Visit: Payer: MEDICARE | Admitting: Family Medicine

## 2016-07-21 ENCOUNTER — Ambulatory Visit (INDEPENDENT_AMBULATORY_CARE_PROVIDER_SITE_OTHER): Payer: MEDICARE | Admitting: Family Medicine

## 2016-07-21 ENCOUNTER — Encounter: Payer: Self-pay | Admitting: Family Medicine

## 2016-07-21 VITALS — BP 110/70 | HR 72 | Temp 97.6°F | Resp 16 | Wt 99.8 lb

## 2016-07-21 DIAGNOSIS — M542 Cervicalgia: Secondary | ICD-10-CM

## 2016-07-21 NOTE — Progress Notes (Signed)
Subjective:     Patient ID: Mariah Levy, female   DOB: July 07, 1940, 76 y.o.   MRN: [id number]  HPI  Chief Complaint  Patient presents with  . Headache    Patient returns back to office today for chronic non intractable headache. Patient was seen in office onm 07/02/16, X-ray of cervical spine showed mild arthritic changes and dosc flattening changes. Patient reports that she has been still having frequent headaches daily.   Continues to have daily posterior cervical pain" I wake up with it". [name] trying different pillows to see if they will help. Has tapered down caffeine to one cup of 1/2 caf in the AM. Has had one "bad" migraine like headache 8 days ago without neurological sx. Does not usually take medication for these. Current stressor is her husband's recurrent choking which is being evaluated.   Review of Systems     Objective:   Physical Exam  Constitutional: She appears well-developed and well-nourished. No distress (pleasant).       Assessment:    1. Bilateral posterior neck pain - Ambulatory referral to Orthopedic Surgery    Plan:    Discussed use of two Aleve or Excedrin migraine at onset of migraine like headache. Consider beta blocker.

## 2016-07-21 NOTE — Patient Instructions (Addendum)
For the "bad" migraine headaches try to take two Aleve's or Excedrin migraine at onset. We will call you with the time of the orthopedic referral. Continue with your low caffeine drink choices.

## 2016-07-31 DIAGNOSIS — M542 Cervicalgia: Secondary | ICD-10-CM | POA: Diagnosis not present

## 2016-07-31 DIAGNOSIS — M5030 Other cervical disc degeneration, unspecified cervical region: Secondary | ICD-10-CM | POA: Diagnosis not present

## 2016-08-13 DIAGNOSIS — M542 Cervicalgia: Secondary | ICD-10-CM | POA: Diagnosis not present

## 2016-08-19 DIAGNOSIS — M542 Cervicalgia: Secondary | ICD-10-CM | POA: Diagnosis not present

## 2016-09-15 DIAGNOSIS — G43019 Migraine without aura, intractable, without status migrainosus: Secondary | ICD-10-CM | POA: Diagnosis not present

## 2016-09-15 DIAGNOSIS — R42 Dizziness and giddiness: Secondary | ICD-10-CM | POA: Diagnosis not present

## 2016-09-15 DIAGNOSIS — R531 Weakness: Secondary | ICD-10-CM | POA: Diagnosis not present

## 2016-09-15 DIAGNOSIS — M5481 Occipital neuralgia: Secondary | ICD-10-CM | POA: Diagnosis not present

## 2016-09-18 ENCOUNTER — Other Ambulatory Visit: Payer: Self-pay | Admitting: Neurology

## 2016-09-18 DIAGNOSIS — G43719 Chronic migraine without aura, intractable, without status migrainosus: Secondary | ICD-10-CM

## 2016-09-30 ENCOUNTER — Ambulatory Visit
Admission: RE | Admit: 2016-09-30 | Discharge: 2016-09-30 | Disposition: A | Discharge status: Home / Self Care | Payer: MEDICARE | Source: Ambulatory Visit | Attending: Neurology | Admitting: Neurology

## 2016-09-30 DIAGNOSIS — J341 Cyst and mucocele of nose and nasal sinus: Secondary | ICD-10-CM | POA: Insufficient documentation

## 2016-09-30 DIAGNOSIS — I6782 Cerebral ischemia: Secondary | ICD-10-CM | POA: Insufficient documentation

## 2016-09-30 DIAGNOSIS — G43719 Chronic migraine without aura, intractable, without status migrainosus: Secondary | ICD-10-CM | POA: Diagnosis not present

## 2016-09-30 DIAGNOSIS — G43709 Chronic migraine without aura, not intractable, without status migrainosus: Secondary | ICD-10-CM | POA: Diagnosis not present

## 2016-10-14 DIAGNOSIS — R531 Weakness: Secondary | ICD-10-CM | POA: Diagnosis not present

## 2016-10-14 DIAGNOSIS — R404 Transient alteration of awareness: Secondary | ICD-10-CM | POA: Diagnosis not present

## 2016-11-25 DIAGNOSIS — R569 Unspecified convulsions: Secondary | ICD-10-CM | POA: Diagnosis not present

## 2016-12-04 ENCOUNTER — Ambulatory Visit (INDEPENDENT_AMBULATORY_CARE_PROVIDER_SITE_OTHER): Payer: MEDICARE | Admitting: Family Medicine

## 2016-12-04 ENCOUNTER — Encounter: Payer: Self-pay | Admitting: Family Medicine

## 2016-12-04 VITALS — BP 144/80 | HR 74 | Temp 98.5°F | Resp 16 | Wt 103.0 lb

## 2016-12-04 DIAGNOSIS — Z833 Family history of diabetes mellitus: Secondary | ICD-10-CM | POA: Diagnosis not present

## 2016-12-04 DIAGNOSIS — Z131 Encounter for screening for diabetes mellitus: Secondary | ICD-10-CM | POA: Diagnosis not present

## 2016-12-04 DIAGNOSIS — R631 Polydipsia: Secondary | ICD-10-CM

## 2016-12-04 NOTE — Progress Notes (Signed)
Subjective:     Patient ID: Mariah Levy, female   DOB: 03-11-1941, 76 y.o.   MRN: [id number]  HPI  Chief Complaint  Patient presents with  . Polydipsia    for about a week. She says she can not get enough water. She says this is very unlike her. She has not started for tried any new medications.   States she has resumed drinking 3 -4 cups of coffee daily and bottled water. Nocturia x one. Also has been seeing neurology with recent abnormal EEG. Was placed on low dose Lamictal but patient has not picked up yet. There is underlying chronic stress in her life due to her husband's medical problems.   Review of Systems     Objective:   Physical Exam  Constitutional: She appears well-developed and well-nourished. No distress.  Cardiovascular: Normal rate and regular rhythm.   Pulmonary/Chest: Breath sounds normal.  Musculoskeletal: She exhibits no edema (of lower extremities).  Psychiatric:  Mildly anxious       Assessment:    1. Polydipsia - Renal function panel - HgB A1c    Plan:   Further f/u pending lab work.

## 2016-12-04 NOTE — Patient Instructions (Signed)
We will call you with the lab work. 

## 2016-12-05 LAB — RENAL FUNCTION PANEL
ALBUMIN: 4.6 g/dL (ref 3.5–4.8)
BUN/Creatinine Ratio: 19 (ref 12–28)
BUN: 16 mg/dL (ref 8–27)
CHLORIDE: 97 mmol/L (ref 96–106)
CO2: 26 mmol/L (ref 18–29)
Calcium: 10.1 mg/dL (ref 8.7–10.3)
Creatinine, Ser: 0.86 mg/dL (ref 0.57–1.00)
GFR calc Af Amer: 76 mL/min/{1.73_m2} (ref >59)
GFR, EST NON AFRICAN AMERICAN: 66 mL/min/{1.73_m2} (ref >59)
GLUCOSE: 112 mg/dL — AB (ref 65–99)
PHOSPHORUS: 3.5 mg/dL (ref 2.5–4.5)
POTASSIUM: 5.4 mmol/L — AB (ref 3.5–5.2)
Sodium: 140 mmol/L (ref 134–144)

## 2016-12-05 LAB — HEMOGLOBIN A1C
ESTIMATED AVERAGE GLUCOSE: 103 mg/dL
Hgb A1c MFr Bld: 5.2 % (ref 4.8–5.6)

## 2016-12-19 DIAGNOSIS — M5481 Occipital neuralgia: Secondary | ICD-10-CM | POA: Diagnosis not present

## 2016-12-19 DIAGNOSIS — G43019 Migraine without aura, intractable, without status migrainosus: Secondary | ICD-10-CM | POA: Diagnosis not present

## 2016-12-19 DIAGNOSIS — R569 Unspecified convulsions: Secondary | ICD-10-CM | POA: Diagnosis not present

## 2017-01-01 ENCOUNTER — Other Ambulatory Visit: Payer: Self-pay | Admitting: Family Medicine

## 2017-01-01 DIAGNOSIS — I10 Essential (primary) hypertension: Secondary | ICD-10-CM

## 2017-01-01 NOTE — Telephone Encounter (Signed)
Called about refill. Please advise.

## 2017-02-20 ENCOUNTER — Other Ambulatory Visit: Payer: Self-pay | Admitting: Family Medicine

## 2017-04-02 ENCOUNTER — Ambulatory Visit
Admission: RE | Admit: 2017-04-02 | Discharge: 2017-04-02 | Disposition: A | Discharge status: Home / Self Care | Payer: MEDICARE | Source: Ambulatory Visit | Attending: Family Medicine | Admitting: Family Medicine

## 2017-04-02 ENCOUNTER — Ambulatory Visit (INDEPENDENT_AMBULATORY_CARE_PROVIDER_SITE_OTHER): Payer: MEDICARE | Admitting: Family Medicine

## 2017-04-02 ENCOUNTER — Encounter: Payer: Self-pay | Admitting: Family Medicine

## 2017-04-02 VITALS — BP 138/78 | HR 76 | Temp 98.3°F | Resp 16 | Wt 101.0 lb

## 2017-04-02 DIAGNOSIS — K5900 Constipation, unspecified: Secondary | ICD-10-CM | POA: Diagnosis not present

## 2017-04-02 NOTE — Progress Notes (Signed)
Patient: Mariah Levy Female    DOB: December 11, 1940   76 y.o.   MRN: [id number] Visit Date: 04/02/2017  Today's Provider: Lavon Paganini, MD   Chief Complaint  Patient presents with  . Constipation   Subjective:    Constipation  This is a new problem. Episode onset: irregularities started 3-4 months ago. The problem has been gradually worsening since onset. Her stool frequency is 1 time per week or less (she states "I have a pebble drop now and then, but I have not had a regular BM in 2 weeks"). The stool is described as pellet like. The patient is on a high fiber diet. She exercises regularly (yard work). There has been adequate water intake. Associated symptoms include abdominal pain ("discomfort" LLQ) and bloating. Pertinent negatives include no anorexia, back pain, diarrhea, difficulty urinating, fecal incontinence, fever, flatus, hematochezia, melena, nausea, rectal pain, vomiting or weight loss. Treatments tried: coffee. The treatment provided no relief.  Pt states she bought OTC Dulcolax, but read in the package insert that she should not take that medication with high blood pressure.   Stools were previously soft and regular (daily) and would come easily after a single cup of coffee in the morning.  Records show abd xray with constipation in 2016.  Has been treated with Miralax and stool softener in the past.  Stool caliber has decreased to smaller than her pinky.  She has sensation that she needs to have BM and then cannot go.  LLQ discomfort at night (fullness feeling).  Last colonoscopy in 2004.   Denies melena, hematochezia, vomiting, hematemesis, nausea.  + flatus movement. No recent changes in medicaitons or diet.     Allergies  Allergen Reactions  . Atacand  [Candesartan] Shortness Of Breath  . Iodine Shortness Of Breath  . Other Anaphylaxis    Melons of any kind.  . Demerol [Meperidine] Hives  . Propoxyphene Hives    Darvon, Darvocet  . Ramipril Swelling   Paresthesias.  . Penicillins Rash     Current Outpatient Prescriptions:  .  aspirin 81 MG tablet, Take by mouth., Disp: , Rfl:  .  MAGNESIUM PO, Magnesium 250-400mg  daily for migraine headaches, Disp: , Rfl:  .  triamterene-hydrochlorothiazide (MAXZIDE) 75-50 MG tablet, TAKE ONE TABLET BY MOUTH ONCE DAILY, Disp: 90 tablet, Rfl: 2  Review of Systems  Constitutional: Negative for fever and weight loss.  Gastrointestinal: Positive for abdominal pain ("discomfort" LLQ), bloating and constipation. Negative for anorexia, diarrhea, flatus, hematochezia, melena, nausea, rectal pain and vomiting.  Genitourinary: Negative for difficulty urinating.  Musculoskeletal: Negative for back pain.    Social History  Substance Use Topics  . Smoking status: Former Smoker    Packs/day: 1.00    Years: 2.00    Quit date: 06/30/1959  . Smokeless tobacco: Never Used  . Alcohol use 4.2 oz/week    7 Glasses of wine per week     Comment: drinks wine with dinner   Objective:   BP 138/78 (BP Location: Left Arm, Patient Position: Sitting, Cuff Size: Normal)   Pulse 76   Temp 98.3 F (36.8 C) (Oral)   Resp 16   Wt 101 lb (45.8 kg)   BMI 19.08 kg/m  Vitals:   04/02/17 1614  BP: 138/78  Pulse: 76  Resp: 16  Temp: 98.3 F (36.8 C)  TempSrc: Oral  Weight: 101 lb (45.8 kg)     Physical Exam  Constitutional: [name] oriented to person, place, and time. She  appears well-developed and well-nourished. No distress.  HENT:  Head: Normocephalic and atraumatic.  Eyes: Conjunctivae are normal. No scleral icterus.  Cardiovascular: Normal rate, regular rhythm, normal heart sounds and intact distal pulses.   No murmur heard. Pulmonary/Chest: Effort normal and breath sounds normal. No respiratory distress. She has no wheezes. She has no rales.  Abdominal: Soft. Bowel sounds are normal. She exhibits no distension and no mass. There is tenderness (mild, LLQ). There is no rebound and no guarding.    Musculoskeletal: She exhibits no edema or deformity.  Neurological: [name] alert and oriented to person, place, and time.  Skin: Skin is warm and dry. No rash noted.  Psychiatric: She has a normal mood and affect. Her behavior is normal.  Vitals reviewed.      Assessment & Plan:      Problem List Items Addressed This Visit      Other   Constipation - Primary    Patient's report is somewhat concerning of changing caliber of stool as well as new-onset constipation Record review does reveal previous history of constipation and 2016 for which she was treated with Mira lax and Colace Abdominal exam is benign today Not concerning for bowel shock shin given continued flatus and bowel sounds on exam We will obtain abdominal x-ray to rule out early signs of SBO Start Mira lax and Colace Return precautions discussed If does not improve with this treatment, will likely need GI referral for colonoscopy      Relevant Orders   DG Abd 2 Views     F/u in 1 month for AWV/CPE     The entirety of the information documented in the History of Present Illness, Review of Systems and Physical Exam were personally obtained by me. Portions of this information were initially documented by Raquel Sarna Ratchford, CMA and reviewed by me for thoroughness and accuracy.     Lavon Paganini, MD  [facility]

## 2017-04-02 NOTE — Patient Instructions (Addendum)
Start Miralax 1 capful tonight in a glass of water with 2 pills of Colace 100mg    Get abdominal XRay this afternoon.  We will call with results tomorrow and then can increase the Miralax.   Constipation, Adult Constipation is when a person has fewer bowel movements in a week than normal, has difficulty having a bowel movement, or has stools that are dry, hard, or larger than normal. Constipation may be caused by an underlying condition. It may become worse with age if a person takes certain medicines and does not take in enough fluids. Follow these instructions at home: Eating and drinking   Eat foods that have a lot of fiber, such as fresh fruits and vegetables, whole grains, and beans.  Limit foods that are high in fat, low in fiber, or overly processed, such as french fries, hamburgers, cookies, candies, and soda.  Drink enough fluid to keep your urine clear or pale yellow. General instructions  Exercise regularly or as told by your health care provider.  Go to the restroom when you have the urge to go. Do not hold it in.  Take over-the-counter and prescription medicines only as told by your health care provider. These include any fiber supplements.  Practice pelvic floor retraining exercises, such as deep breathing while relaxing the lower abdomen and pelvic floor relaxation during bowel movements.  Watch your condition for any changes.  Keep all follow-up visits as told by your health care provider. This is important. Contact a health care provider if:  You have pain that gets worse.  You have a fever.  You do not have a bowel movement after 4 days.  You vomit.  You are not hungry.  You lose weight.  You are bleeding from the anus.  You have thin, pencil-like stools. Get help right away if:  You have a fever and your symptoms suddenly get worse.  You leak stool or have blood in your stool.  Your abdomen is bloated.  You have severe pain in your  abdomen.  You feel dizzy or you faint. This information is not intended to replace advice given to you by your health care provider. Make sure you discuss any questions you have with your health care provider. Document Released: 03/14/2004 Document Revised: 01/04/2016 Document Reviewed: 12/05/2015 Elsevier Interactive Patient Education  2017 [employer].

## 2017-04-02 NOTE — Assessment & Plan Note (Signed)
Patient's report is somewhat concerning of changing caliber of stool as well as new-onset constipation Record review does reveal previous history of constipation and 2016 for which she was treated with Mira lax and Colace Abdominal exam is benign today Not concerning for bowel shock shin given continued flatus and bowel sounds on exam We will obtain abdominal x-ray to rule out early signs of SBO Start Mira lax and Colace Return precautions discussed If does not improve with this treatment, will likely need GI referral for colonoscopy

## 2017-04-03 ENCOUNTER — Telehealth: Payer: Self-pay

## 2017-04-03 NOTE — Telephone Encounter (Signed)
Pt advised.

## 2017-04-03 NOTE — Telephone Encounter (Signed)
-----   Message from Virginia Crews, MD sent at 04/03/2017  3:32 PM EDT ----- A lot of constipaiton. No obstruction.  OK to increase Miralax and colace to BID.  Can increase to miralax TID tomorrow if this doesn't help.  Virginia Crews, MD, MPH [facility] 04/03/2017 3:32 PM

## 2017-07-25 DIAGNOSIS — G43909 Migraine, unspecified, not intractable, without status migrainosus: Secondary | ICD-10-CM | POA: Diagnosis not present

## 2017-08-23 IMAGING — CT CT HEAD W/O CM
1 series · 16 of 30 positions shown, 20 images · non-contrast
Comparison: None.

CLINICAL DATA: Dizziness and numbness about the left side of the
face beginning this morning. Left leg buckling. Initial encounter.

EXAM:
CT HEAD WITHOUT CONTRAST
TECHNIQUE: Contiguous axial images were obtained from the base of the skull
through the vertex without intravenous contrast.

[Series 2: head wo · axial · 0.39mm/px · z∈[+637,+763]mm · 16 of 32 slices shown, 20 images]
[im 2/32  brain]
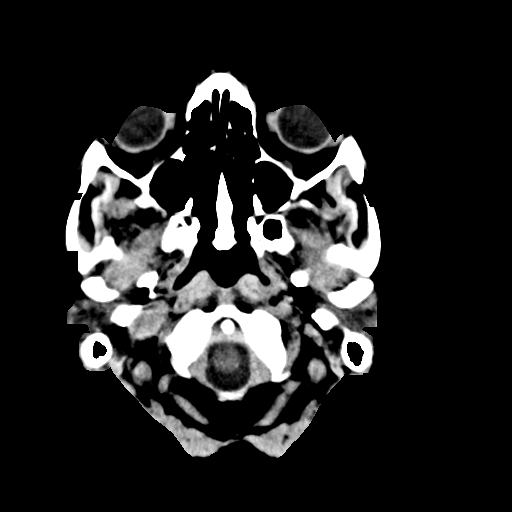
[im 2/32  bone]
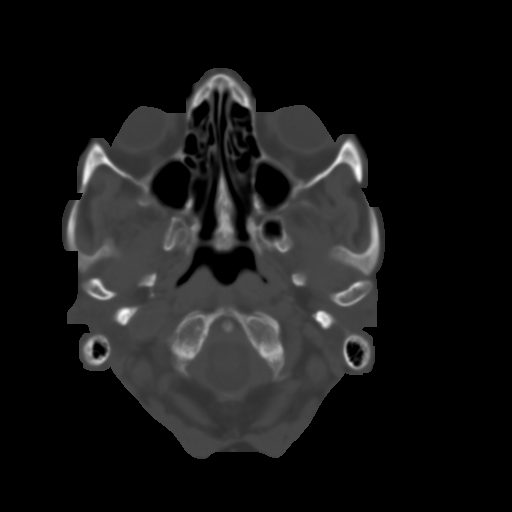
[im 4/32  brain]
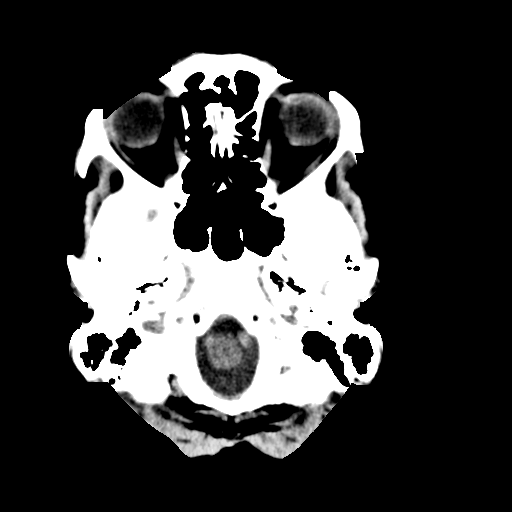
[im 6/32  brain]
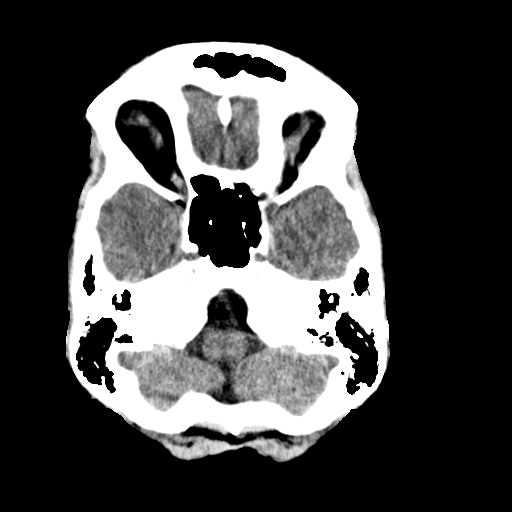
[im 8/32  brain]
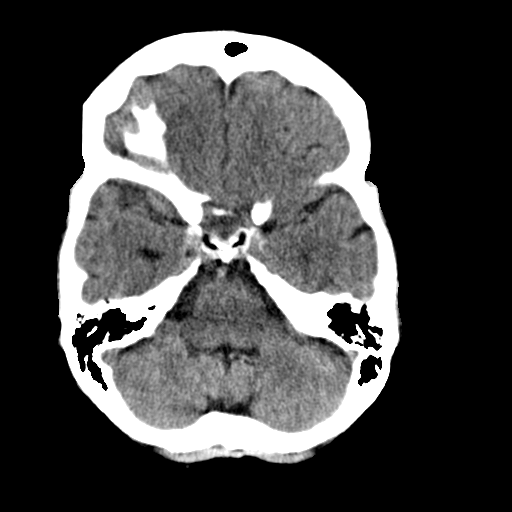
[im 9/32  brain]
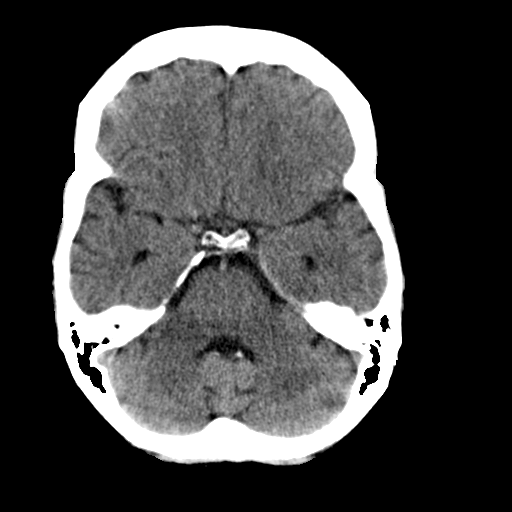
[im 9/32  bone]
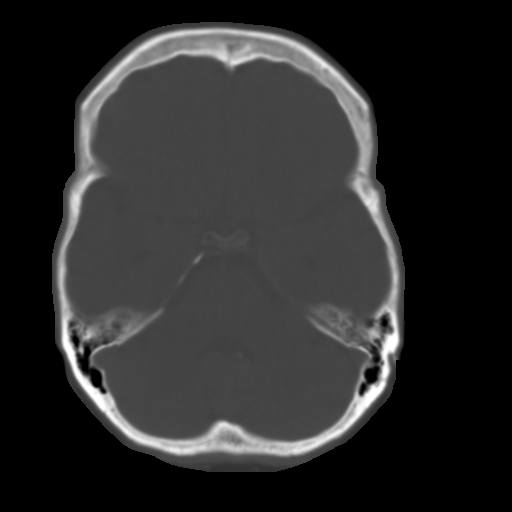
[im 11/32  brain]
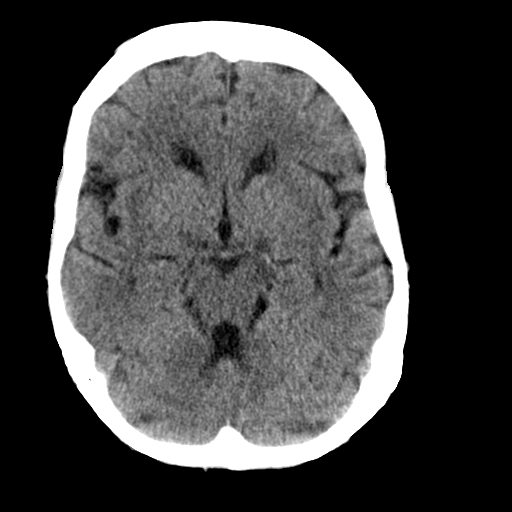
[im 13/32  brain]
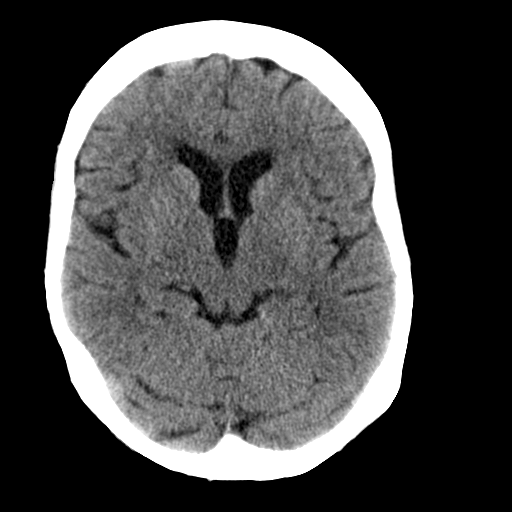
[im 15/32  brain]
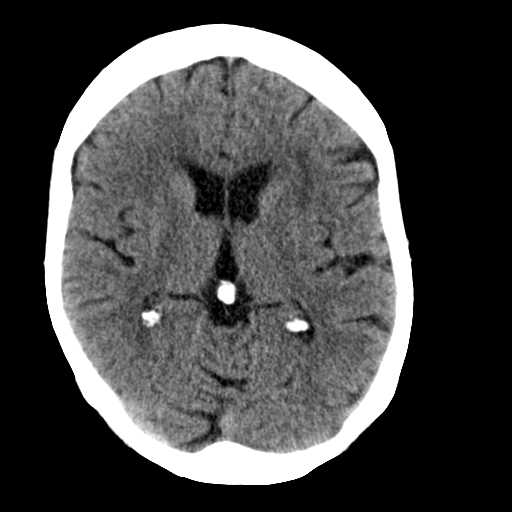
[im 17/32  brain]
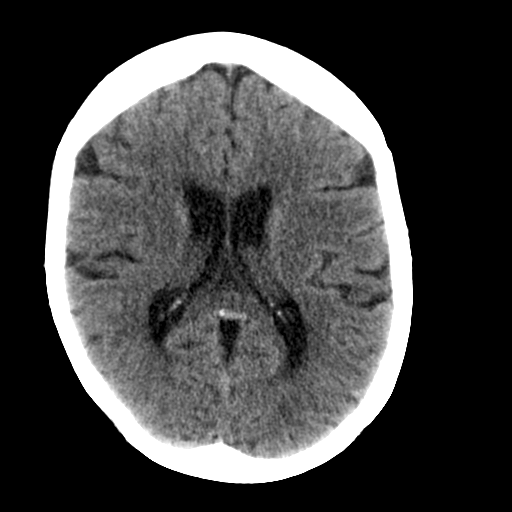
[im 17/32  bone]
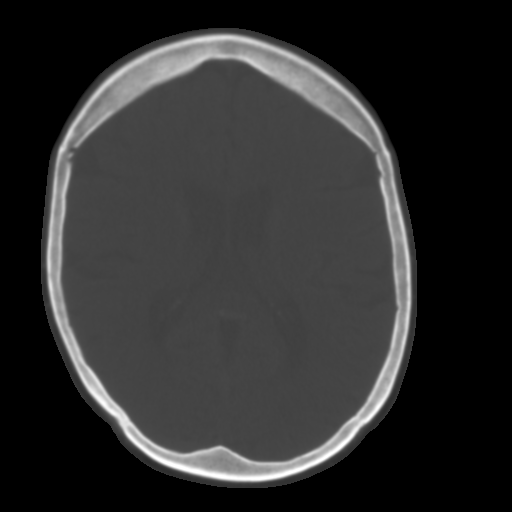
[im 19/32  brain]
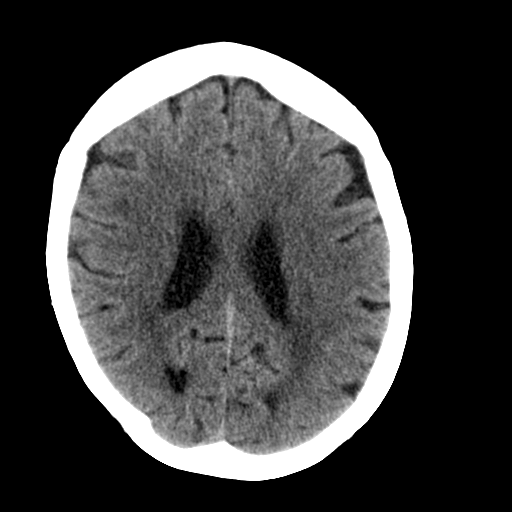
[im 21/32  brain]
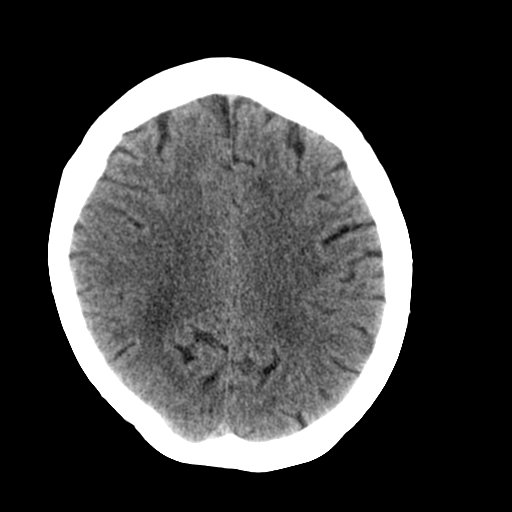
[im 23/32  brain]
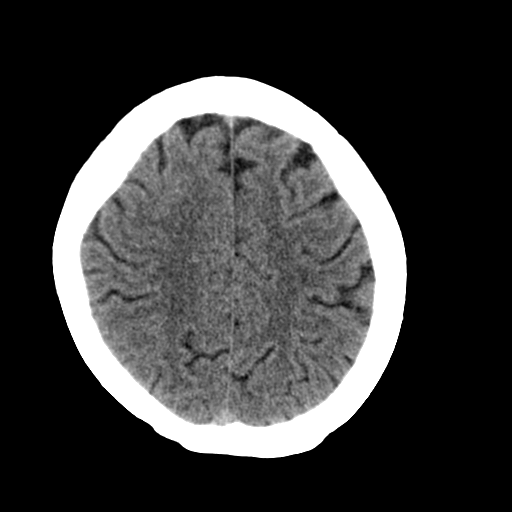
[im 24/32  brain]
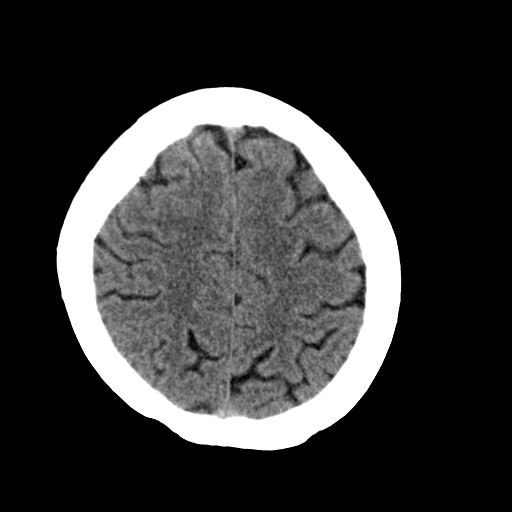
[im 24/32  bone]
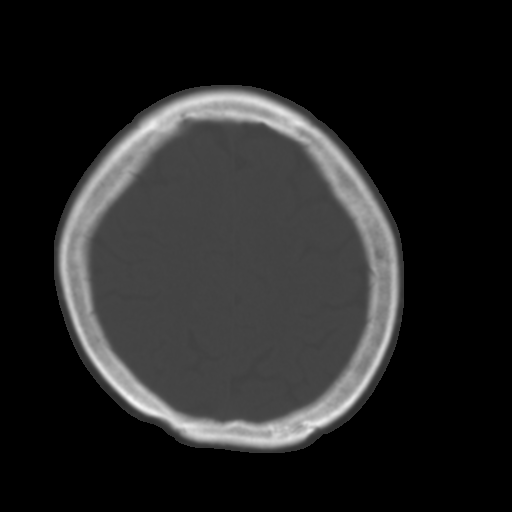
[im 26/32  brain]
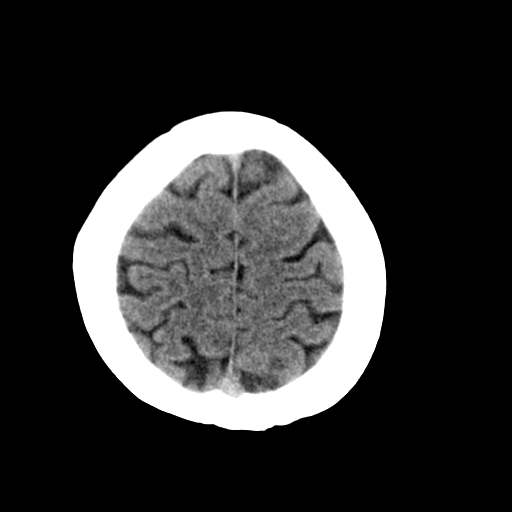
[im 28/32  brain]
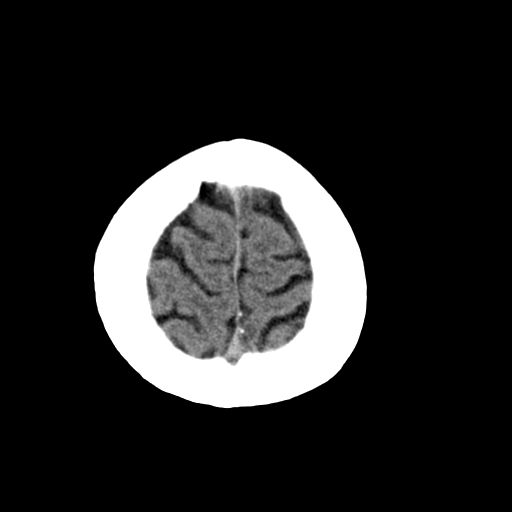
[im 30/32  brain]
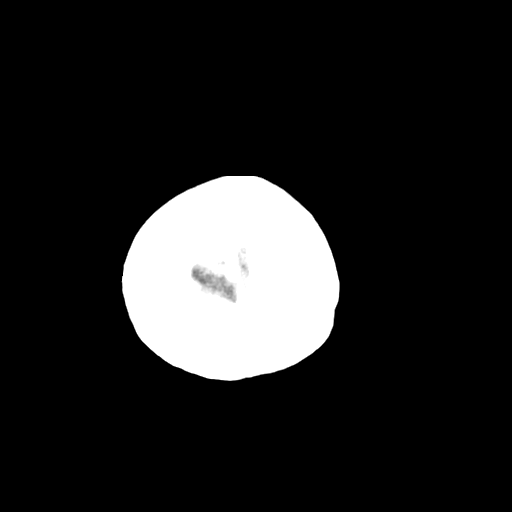

[16 of 30 positions shown; findings below may reference images not displayed]

FINDINGS: Chronic microvascular ischemic change is identified. There is no
evidence of acute intracranial abnormality including hemorrhage,
infarct, mass lesion, mass effect, midline shift or abnormal
extra-axial fluid collection. No hydrocephalus or pneumocephalus.
The calvarium is intact.
IMPRESSION: No acute abnormality.

## 2017-09-08 DIAGNOSIS — H43812 Vitreous degeneration, left eye: Secondary | ICD-10-CM | POA: Diagnosis not present

## 2017-09-08 DIAGNOSIS — H353132 Nonexudative age-related macular degeneration, bilateral, intermediate dry stage: Secondary | ICD-10-CM | POA: Diagnosis not present

## 2017-10-08 DIAGNOSIS — H43812 Vitreous degeneration, left eye: Secondary | ICD-10-CM | POA: Diagnosis not present

## 2017-10-19 ENCOUNTER — Other Ambulatory Visit: Payer: Self-pay | Admitting: Family Medicine

## 2017-10-19 DIAGNOSIS — I10 Essential (primary) hypertension: Secondary | ICD-10-CM

## 2017-10-19 NOTE — Telephone Encounter (Signed)
See refill request. I think she has moved over to your patient panel

## 2017-10-19 NOTE — Telephone Encounter (Signed)
Tried calling pt; NA. Sent MyChart message asking her to schedule appointment.

## 2017-10-19 NOTE — Telephone Encounter (Signed)
Please have patient schedule BP and chronic disease f/u.  She was also supposed to some in for CPE and AWV.  We can go over BP at CPE as it appears stable.  Virginia Crews, MD, MPH [facility] 10/19/2017 10:17 AM

## 2017-10-21 ENCOUNTER — Other Ambulatory Visit: Payer: Self-pay | Admitting: Family Medicine

## 2017-10-21 DIAGNOSIS — I10 Essential (primary) hypertension: Secondary | ICD-10-CM

## 2018-01-14 ENCOUNTER — Other Ambulatory Visit: Payer: Self-pay | Admitting: Family Medicine

## 2018-01-14 DIAGNOSIS — I10 Essential (primary) hypertension: Secondary | ICD-10-CM

## 2018-01-14 NOTE — Telephone Encounter (Signed)
FU scheduled.

## 2018-01-14 NOTE — Telephone Encounter (Signed)
Patient needs f/u visit before further refills will be given.  Virginia Crews, MD, MPH [facility] 01/14/2018 2:00 PM

## 2018-01-19 ENCOUNTER — Encounter: Payer: Self-pay | Admitting: Family Medicine

## 2018-01-19 ENCOUNTER — Ambulatory Visit (INDEPENDENT_AMBULATORY_CARE_PROVIDER_SITE_OTHER): Payer: MEDICARE | Admitting: Family Medicine

## 2018-01-19 VITALS — BP 136/84 | HR 70 | Temp 98.0°F | Resp 16 | Ht 60.0 in | Wt 97.8 lb

## 2018-01-19 DIAGNOSIS — I10 Essential (primary) hypertension: Secondary | ICD-10-CM

## 2018-01-19 DIAGNOSIS — E7800 Pure hypercholesterolemia, unspecified: Secondary | ICD-10-CM

## 2018-01-19 DIAGNOSIS — Z130 Encounter for screening for diseases of the blood and blood-forming organs and certain disorders involving the immune mechanism: Secondary | ICD-10-CM

## 2018-01-19 MED ORDER — TRIAMTERENE-HCTZ 37.5-25 MG PO TABS: 1.0000 | ORAL_TABLET | Freq: Every day | ORAL | 3 refills | Status: DC

## 2018-01-19 NOTE — Progress Notes (Signed)
Patient: Mariah Levy Female    DOB: Jan 16, 1941   77 y.o.   MRN: [id number] Visit Date: 01/19/2018  Today's Provider: Lavon Paganini, MD   I, Mariah Levy, CMA, am acting as scribe for Mariah Paganini, MD.  Chief Complaint  Patient presents with  . Hypertension   Subjective:    HPI      Hypertension, follow-up:  BP Readings from Last 3 Encounters:  01/19/18 136/84  04/02/17 138/78  12/04/16 (!) 144/80    She was last seen for hypertension more than 1 year ago. She saw Mariah Levy at that time. BP at that visit was 140/90. Management since that visit includes continuing HCTZ.. She reports good compliance with treatment. [name] not having side effects.  [name] exercising. Stays active. [name] not adherent to low salt diet.   Outside blood pressures are "always very good." Recently 116/67. [name].  She feels like [name] gets low when she is up and about a lot. Patient denies chest pain, chest pressure/discomfort, claudication, dyspnea, exertional chest pressure/discomfort, irregular heart beat, lower extremity edema, near-syncope, orthopnea, palpitations and syncope.   Cardiovascular risk factors include advanced age (older than 84 for men, 22 for women), dyslipidemia and hypertension.  Use of agents associated with hypertension: none.     Weight trend: decreasing steadily Wt Readings from Last 3 Encounters:  01/19/18 97 lb 12.8 oz (44.4 kg)  04/02/17 101 lb (45.8 kg)  12/04/16 103 lb (46.7 kg)    Current diet: in general, a "healthy" diet    ------------------------------------------------------------------------  Lipid/Cholesterol, Follow-up:   Last seen for this over 1 year ago. Management changes since that visit include working on lifestyle changes. . Last Lipid Panel:    Component Value Date/Time   CHOL 287 (H) 07/02/2016 0859   TRIG 58 07/02/2016 0859   HDL 120 07/02/2016 0859   CHOLHDL 2.4 07/02/2016 0859   LDLCALC 155  (H) 07/02/2016 0859    Risk factors for vascular disease include cerebral vascular disease, hypercholesterolemia and hypertension -------------------------------------------------------------------   Allergies  Allergen Reactions  . Atacand  [Candesartan] Shortness Of Breath  . Iodine Shortness Of Breath  . Other Anaphylaxis    Melons of any kind.  . Demerol [Meperidine] Hives  . Propoxyphene Hives    Darvon, Darvocet  . Ramipril Swelling    Paresthesias.  . Penicillins Rash     Current Outpatient Medications:  .  aspirin 81 MG tablet, Take by mouth., Disp: , Rfl:  .  MAGNESIUM PO, Magnesium 250-400mg  daily for migraine headaches, Disp: , Rfl:  .  Multiple Vitamins-Minerals (ICAPS AREDS 2 PO), Take by mouth., Disp: , Rfl:  .  triamterene-hydrochlorothiazide (MAXZIDE) 75-50 MG tablet, TAKE 1 TABLET BY MOUTH ONCE DAILY, Disp: 30 tablet, Rfl: 0 .  TURMERIC PO, Take by mouth., Disp: , Rfl:   Review of Systems  Constitutional: Positive for fatigue. Negative for activity change, appetite change, chills, diaphoresis, fever and unexpected weight change.  Respiratory: Negative for shortness of breath.   Cardiovascular: Negative for chest pain, palpitations and leg swelling.  Psychiatric/Behavioral: Positive for sleep disturbance.    Social History   Tobacco Use  . Smoking status: Former Smoker    Packs/day: 1.00    Years: 2.00    Pack years: 2.00    Last attempt to quit: 06/30/1959    Years since quitting: 58.5  . Smokeless tobacco: Never Used  Substance Use Topics  . Alcohol use: Yes  Alcohol/week: 4.2 oz    Types: 7 Glasses of wine per week    Comment: drinks wine with dinner   Objective:   BP 136/84 (BP Location: Left Arm, Patient Position: Sitting, Cuff Size: Small)   Pulse 70   Temp 98 F (36.7 C) (Oral)   Resp 16   Ht 5' (1.524 m)   Wt 97 lb 12.8 oz (44.4 kg)   SpO2 93%   BMI 19.10 kg/m  Vitals:   01/19/18 1108  BP: 136/84  Pulse: 70  Resp: 16  Temp:  98 F (36.7 C)  TempSrc: Oral  SpO2: 93%  Weight: 97 lb 12.8 oz (44.4 kg)  Height: 5' (1.524 m)     Physical Exam  Constitutional: [name] oriented to person, place, and time. She appears well-developed and well-nourished. No distress.  HENT:  Head: Normocephalic and atraumatic.  Eyes: Conjunctivae are normal. No scleral icterus.  Neck: Neck supple. No thyromegaly present.  Cardiovascular: Normal rate, regular rhythm, normal heart sounds and intact distal pulses.  No murmur heard. Pulmonary/Chest: Effort normal and breath sounds normal. No respiratory distress. She has no wheezes. She has no rales.  Musculoskeletal: She exhibits no edema.  Lymphadenopathy:    She has no cervical adenopathy.  Neurological: [name] alert and oriented to person, place, and time.  Skin: Skin is warm and dry. Capillary refill takes less than 2 seconds. No rash noted.  Psychiatric: She has a normal mood and affect. Her behavior is normal.  Vitals reviewed.       Assessment & Plan:   Problem List Items Addressed This Visit      Cardiovascular and Mediastinum   Essential hypertension - Primary    Well controlled, but possibly with symptomatic hypotension at home (seems to have some white coat hypertension) Will decrease Maxide dose to 37.5-25 mg daily Discussed that HCTZ dose above 25 mg seems to increase side effects without helping blood pressure very much At upcoming CPE, we will follow-up on her blood pressure      Relevant Medications   triamterene-hydrochlorothiazide (MAXZIDE-25) 37.5-25 MG tablet   Other Relevant Orders   Comprehensive metabolic panel     Other   Hypercholesteremia    Recheck lipid panel and CMP Patient has been hesitant to take a statin due to possible side effects I will calculate her ASCVD risk and we will make a joint decision about statin use      Relevant Medications   triamterene-hydrochlorothiazide (MAXZIDE-25) 37.5-25 MG tablet   Other Relevant Orders    Lipid panel   Comprehensive metabolic panel    Other Visit Diagnoses    Screening for blood disease       Relevant Orders   CBC       Return in about 6 weeks (around 03/02/2018) for AWV (with me).   The entirety of the information documented in the History of Present Illness, Review of Systems and Physical Exam were personally obtained by me. Portions of this information were initially documented by Raquel Sarna Ratchford, CMA and reviewed by me for thoroughness and accuracy.    Virginia Crews, MD, MPH [facility] 01/20/2018 11:26 AM

## 2018-01-19 NOTE — Patient Instructions (Addendum)
Decrease Maxzide to 37.5-25mg  daily   Hypertension Hypertension, commonly called high blood pressure, is when the force of blood pumping through the arteries is too strong. The arteries are the blood vessels that carry blood from the heart throughout the body. Hypertension forces the heart to work harder to pump blood and may cause arteries to become narrow or stiff. Having untreated or uncontrolled hypertension can cause heart attacks, strokes, kidney disease, and other problems. A blood pressure reading consists of a higher number over a lower number. Ideally, your blood pressure should be below 120/80. The first ("top") number is called the systolic pressure. It is a measure of the pressure in your arteries as your heart beats. The second ("bottom") number is called the diastolic pressure. It is a measure of the pressure in your arteries as the heart relaxes. What are the causes? The cause of this condition is not known. What increases the risk? Some risk factors for high blood pressure are under your control. Others are not. Factors you can change  Smoking.  Having type 2 diabetes mellitus, high cholesterol, or both.  Not getting enough exercise or physical activity.  Being overweight.  Having too much fat, sugar, calories, or salt (sodium) in your diet.  Drinking too much alcohol. Factors that are difficult or impossible to change  Having chronic kidney disease.  Having a family history of high blood pressure.  Age. Risk increases with age.  Race. You may be at higher risk if you are African-American.  Gender. Men are at higher risk than women before age 58. After age 31, women are at higher risk than men.  Having obstructive sleep apnea.  Stress. What are the signs or symptoms? Extremely high blood pressure (hypertensive crisis) may cause:  Headache.  Anxiety.  Shortness of breath.  Nosebleed.  Nausea and vomiting.  Severe chest pain.  Jerky movements you  cannot control (seizures).  How is this diagnosed? This condition is diagnosed by measuring your blood pressure while you are seated, with your arm resting on a surface. The cuff of the blood pressure monitor will be placed directly against the skin of your upper arm at the level of your heart. It should be measured at least twice using the same arm. Certain conditions can cause a difference in blood pressure between your right and left arms. Certain factors can cause blood pressure readings to be lower or higher than normal (elevated) for a short period of time:  When your blood pressure is higher when you are in a health care provider's office than when you are at home, this is called white coat hypertension. Most people with this condition do not need medicines.  When your blood pressure is higher at home than when you are in a health care provider's office, this is called masked hypertension. Most people with this condition may need medicines to control blood pressure.  If you have a high blood pressure reading during one visit or you have normal blood pressure with other risk factors:  You may be asked to return on a different day to have your blood pressure checked again.  You may be asked to monitor your blood pressure at home for 1 week or longer.  If you are diagnosed with hypertension, you may have other blood or imaging tests to help your health care provider understand your overall risk for other conditions. How is this treated? This condition is treated by making healthy lifestyle changes, such as eating healthy foods, exercising  more, and reducing your alcohol intake. Your health care provider may prescribe medicine if lifestyle changes are not enough to get your blood pressure under control, and if:  Your systolic blood pressure is above 130.  Your diastolic blood pressure is above 80.  Your personal target blood pressure may vary depending on your medical conditions, your age,  and other factors. Follow these instructions at home: Eating and drinking  Eat a diet that is high in fiber and potassium, and low in sodium, added sugar, and fat. An example eating plan is called the DASH (Dietary Approaches to Stop Hypertension) diet. To eat this way: ? Eat plenty of fresh fruits and vegetables. Try to fill half of your plate at each meal with fruits and vegetables. ? Eat whole grains, such as whole wheat pasta, brown rice, or whole grain bread. Fill about one quarter of your plate with whole grains. ? Eat or drink low-fat dairy products, such as skim milk or low-fat yogurt. ? Avoid fatty cuts of meat, processed or cured meats, and poultry with skin. Fill about one quarter of your plate with lean proteins, such as fish, chicken without skin, beans, eggs, and tofu. ? Avoid premade and processed foods. These tend to be higher in sodium, added sugar, and fat.  Reduce your daily sodium intake. Most people with hypertension should eat less than 1,500 mg of sodium a day.  Limit alcohol intake to no more than 1 drink a day for nonpregnant women and 2 drinks a day for men. One drink equals 12 oz of beer, 5 oz of wine, or 1 oz of hard liquor. Lifestyle  Work with your health care provider to maintain a healthy body weight or to lose weight. Ask what an ideal weight is for you.  Get at least 30 minutes of exercise that causes your heart to beat faster (aerobic exercise) most days of the week. Activities may include walking, swimming, or biking.  Include exercise to strengthen your muscles (resistance exercise), such as pilates or lifting weights, as part of your weekly exercise routine. Try to do these types of exercises for 30 minutes at least 3 days a week.  Do not use any products that contain nicotine or tobacco, such as cigarettes and e-cigarettes. If you need help quitting, ask your health care provider.  Monitor your blood pressure at home as told by your health care  provider.  Keep all follow-up visits as told by your health care provider. This is important. Medicines  Take over-the-counter and prescription medicines only as told by your health care provider. Follow directions carefully. Blood pressure medicines must be taken as prescribed.  Do not skip doses of blood pressure medicine. Doing this puts you at risk for problems and can make the medicine less effective.  Ask your health care provider about side effects or reactions to medicines that you should watch for. Contact a health care provider if:  You think you are having a reaction to a medicine you are taking.  You have headaches that keep coming back (recurring).  You feel dizzy.  You have swelling in your ankles.  You have trouble with your vision. Get help right away if:  You develop a severe headache or confusion.  You have unusual weakness or numbness.  You feel faint.  You have severe pain in your chest or abdomen.  You vomit repeatedly.  You have trouble breathing. Summary  Hypertension is when the force of blood pumping through your arteries is  too strong. If this condition is not controlled, it may put you at risk for serious complications.  Your personal target blood pressure may vary depending on your medical conditions, your age, and other factors. For most people, a normal blood pressure is less than 120/80.  Hypertension is treated with lifestyle changes, medicines, or a combination of both. Lifestyle changes include weight loss, eating a healthy, low-sodium diet, exercising more, and limiting alcohol. This information is not intended to replace advice given to you by your health care provider. Make sure you discuss any questions you have with your health care provider. Document Released: 06/16/2005 Document Revised: 05/14/2016 Document Reviewed: 05/14/2016 Elsevier Interactive Patient Education  [employer].

## 2018-01-20 NOTE — Assessment & Plan Note (Signed)
Recheck lipid panel and CMP Patient has been hesitant to take a statin due to possible side effects I will calculate her ASCVD risk and we will make a joint decision about statin use

## 2018-01-20 NOTE — Assessment & Plan Note (Signed)
Well controlled, but possibly with symptomatic hypotension at home (seems to have some white coat hypertension) Will decrease Maxide dose to 37.5-25 mg daily Discussed that HCTZ dose above 25 mg seems to increase side effects without helping blood pressure very much At upcoming CPE, we will follow-up on her blood pressure

## 2018-01-21 DIAGNOSIS — Z130 Encounter for screening for diseases of the blood and blood-forming organs and certain disorders involving the immune mechanism: Secondary | ICD-10-CM | POA: Diagnosis not present

## 2018-01-21 DIAGNOSIS — E7800 Pure hypercholesterolemia, unspecified: Secondary | ICD-10-CM | POA: Diagnosis not present

## 2018-01-21 DIAGNOSIS — I10 Essential (primary) hypertension: Secondary | ICD-10-CM | POA: Diagnosis not present

## 2018-01-22 ENCOUNTER — Telehealth: Payer: Self-pay

## 2018-01-22 LAB — CBC
HEMOGLOBIN: 12.9 g/dL (ref 11.1–15.9)
Hematocrit: 38.8 % (ref 34.0–46.6)
MCH: 30.8 pg (ref 26.6–33.0)
MCHC: 33.2 g/dL (ref 31.5–35.7)
MCV: 93 fL (ref 79–97)
PLATELETS: 372 10*3/uL (ref 150–450)
RBC: 4.19 x10E6/uL (ref 3.77–5.28)
RDW: 12.5 % (ref 12.3–15.4)
WBC: 5.4 10*3/uL (ref 3.4–10.8)

## 2018-01-22 LAB — LIPID PANEL
CHOL/HDL RATIO: 3.1 ratio (ref 0.0–4.4)
Cholesterol, Total: 260 mg/dL — ABNORMAL HIGH (ref 100–199)
HDL: 83 mg/dL (ref >39)
LDL CALC: 167 mg/dL — AB (ref 0–99)
TRIGLYCERIDES: 49 mg/dL (ref 0–149)
VLDL Cholesterol Cal: 10 mg/dL (ref 5–40)

## 2018-01-22 LAB — COMPREHENSIVE METABOLIC PANEL
A/G RATIO: 2 (ref 1.2–2.2)
ALK PHOS: 42 IU/L (ref 39–117)
ALT: 10 IU/L (ref 0–32)
AST: 19 IU/L (ref 0–40)
Albumin: 4.5 g/dL (ref 3.5–4.8)
BILIRUBIN TOTAL: 0.3 mg/dL (ref 0.0–1.2)
BUN/Creatinine Ratio: 16 (ref 12–28)
BUN: 14 mg/dL (ref 8–27)
CO2: 23 mmol/L (ref 20–29)
Calcium: 9.7 mg/dL (ref 8.7–10.3)
Chloride: 102 mmol/L (ref 96–106)
Creatinine, Ser: 0.86 mg/dL (ref 0.57–1.00)
GFR calc Af Amer: 75 mL/min/{1.73_m2} (ref >59)
GFR calc non Af Amer: 65 mL/min/{1.73_m2} (ref >59)
Globulin, Total: 2.2 g/dL (ref 1.5–4.5)
Glucose: 81 mg/dL (ref 65–99)
POTASSIUM: 3.9 mmol/L (ref 3.5–5.2)
SODIUM: 142 mmol/L (ref 134–144)
TOTAL PROTEIN: 6.7 g/dL (ref 6.0–8.5)

## 2018-01-22 NOTE — Telephone Encounter (Signed)
Pt advised. States she will consider the medication, and will call back is she decides to start this.

## 2018-01-22 NOTE — Telephone Encounter (Signed)
-----   Message from Virginia Crews, MD sent at 01/22/2018  4:36 PM EDT ----- Cholesterol is high.  10 year risk of heart disease/stroke is high at 29.6%.  This has continued to increase over the last 2 years.  Anything above 7.5% suggest that a statin is needed to lower cholesterol and therefore lower heart disease and stroke risk.  I do recommend this for the patient.  And I know that she has been hesitant to take this in the past.  Normal blood sugar, kidney function, liver function, electrolytes, blood counts.  Virginia Crews, MD, MPH [facility] 01/22/2018 4:36 PM

## 2018-02-19 ENCOUNTER — Ambulatory Visit (INDEPENDENT_AMBULATORY_CARE_PROVIDER_SITE_OTHER): Payer: MEDICARE | Admitting: Family Medicine

## 2018-02-19 ENCOUNTER — Encounter: Payer: Self-pay | Admitting: Family Medicine

## 2018-02-19 VITALS — BP 150/84 | HR 73 | Temp 98.1°F | Resp 16 | Ht 60.0 in | Wt 97.4 lb

## 2018-02-19 DIAGNOSIS — I10 Essential (primary) hypertension: Secondary | ICD-10-CM | POA: Diagnosis not present

## 2018-02-19 DIAGNOSIS — Z0000 Encounter for general adult medical examination without abnormal findings: Secondary | ICD-10-CM | POA: Diagnosis not present

## 2018-02-19 NOTE — Assessment & Plan Note (Signed)
Well controlled on home readings Has white coat HTN Continue Maxide 25 Follow-up in 6 months Reviewed recent CMP

## 2018-02-19 NOTE — Patient Instructions (Signed)
Preventive Care 65 Years and Older, Female Preventive care refers to lifestyle choices and visits with your health care provider that can promote health and wellness. What does preventive care include?  A yearly physical exam. This is also called an annual well check.  Dental exams once or twice a year.  Routine eye exams. Ask your health care provider how often you should have your eyes checked.  Personal lifestyle choices, including: ? Daily care of your teeth and gums. ? Regular physical activity. ? Eating a healthy diet. ? Avoiding tobacco and drug use. ? Limiting alcohol use. ? Practicing safe sex. ? Taking low-dose aspirin every day. ? Taking vitamin and mineral supplements as recommended by your health care provider. What happens during an annual well check? The services and screenings done by your health care provider during your annual well check will depend on your age, overall health, lifestyle risk factors, and family history of disease. Counseling Your health care provider may ask you questions about your:  Alcohol use.  Tobacco use.  Drug use.  Emotional well-being.  Home and relationship well-being.  Sexual activity.  Eating habits.  History of falls.  Memory and ability to understand (cognition).  Work and work environment.  Reproductive health.  Screening You may have the following tests or measurements:  Height, weight, and BMI.  Blood pressure.  Lipid and cholesterol levels. These may be checked every 5 years, or more frequently if you are over 50 years old.  Skin check.  Lung cancer screening. You may have this screening every year starting at age 55 if you have a 30-pack-year history of smoking and currently smoke or have quit within the past 15 years.  Fecal occult blood test (FOBT) of the stool. You may have this test every year starting at age 50.  Flexible sigmoidoscopy or colonoscopy. You may have a sigmoidoscopy every 5 years or  a colonoscopy every 10 years starting at age 50.  Hepatitis C blood test.  Hepatitis B blood test.  Sexually transmitted disease (STD) testing.  Diabetes screening. This is done by checking your blood sugar (glucose) after you have not eaten for a while (fasting). You may have this done every 1-3 years.  Bone density scan. This is done to screen for osteoporosis. You may have this done starting at age 78.  Mammogram. This may be done every 1-2 years. Talk to your health care provider about how often you should have regular mammograms.  Talk with your health care provider about your test results, treatment options, and if necessary, the need for more tests. Vaccines Your health care provider may recommend certain vaccines, such as:  Influenza vaccine. This is recommended every year.  Tetanus, diphtheria, and acellular pertussis (Tdap, Td) vaccine. You may need a Td booster every 10 years.  Varicella vaccine. You may need this if you have not been vaccinated.  Zoster vaccine. You may need this after age 60.  Measles, mumps, and rubella (MMR) vaccine. You may need at least one dose of MMR if you were born in 1957 or later. You may also need a second dose.  Pneumococcal 13-valent conjugate (PCV13) vaccine. One dose is recommended after age 78.  Pneumococcal polysaccharide (PPSV23) vaccine. One dose is recommended after age 78.  Meningococcal vaccine. You may need this if you have certain conditions.  Hepatitis A vaccine. You may need this if you have certain conditions or if you travel or work in places where you may be exposed to hepatitis   A.  Hepatitis B vaccine. You may need this if you have certain conditions or if you travel or work in places where you may be exposed to hepatitis B.  Haemophilus influenzae type b (Hib) vaccine. You may need this if you have certain conditions.  Talk to your health care provider about which screenings and vaccines you need and how often you  need them. This information is not intended to replace advice given to you by your health care provider. Make sure you discuss any questions you have with your health care provider. Document Released: 07/13/2015 Document Revised: 03/05/2016 Document Reviewed: 04/17/2015 Elsevier Interactive Patient Education  2018 [employer]  

## 2018-02-19 NOTE — Progress Notes (Signed)
Patient: Mariah Levy, Female    DOB: 07-Mar-1941, 77 y.o.   MRN: [id number] Visit Date: 02/19/2018  Today's Provider: Lavon Paganini, MD   I, Martha Clan, CMA, am acting as scribe for Lavon Paganini, MD.  Chief Complaint  Patient presents with  . Medicare Wellness   Subjective:    Annual wellness visit Mariah Levy is a 77 y.o. female. She feels fairly well. She reports exercising "not as much as I should". Pt would like to start restart her walking routine, but she is afraid that she will be unsafe. She reports she is sleeping fairly well. She wakes up around midnight (about 3-4 hours after going to bed), drinks a cup of coffee, and browses the internet for a few hours beofre she is able to fall back asleep and sleep for another 1-2 hours.  This is unchanged from previous  Last mammogram- 10/25/2012- BI-RADS 2.  She declines any future mammograms Declines pneumococcal vaccine. Pt states she would not treat osteoporosis with a bisphosphonate if OP was found on a Dexa scan.   HTN: - Medications: Maxide 25-this was decreased from [place] 50 at last visit 1 month ago - Compliance: good - Checking BP at home: yes, 120s/70-80s - knows that this is elevated when coming to - Denies any SOB, CP, vision changes, LE edema, medication SEs, or symptoms of hypotension -----------------------------------------------------------   Review of Systems  Constitutional: Negative.   HENT: Negative.   Eyes: Negative.   Respiratory: Negative.   Cardiovascular: Positive for leg swelling. Negative for chest pain and palpitations.  Gastrointestinal: Negative.   Endocrine: Negative.   Genitourinary: Negative.   Musculoskeletal: Positive for neck pain. Negative for arthralgias, back pain, gait problem, joint swelling, myalgias and neck stiffness.  Skin: Negative.   Allergic/Immunologic: Negative.   Neurological: Positive for light-headedness and headaches. Negative for dizziness,  tremors, seizures, syncope, facial asymmetry, speech difficulty, weakness and numbness.  Hematological: Negative.   Psychiatric/Behavioral: Positive for sleep disturbance. Negative for agitation, behavioral problems, confusion, decreased concentration, dysphoric mood, hallucinations, self-injury and suicidal ideas. The patient is not nervous/anxious and is not hyperactive.     Social History   Socioeconomic History  . Marital status: Married    Spouse name: Not on file  . Number of children: 3  . Years of education: H/S  . Highest education level: Not on file  Occupational History  . Occupation: Retired  Scientific laboratory technician  . Financial resource strain: Not on file  . Food insecurity:    Worry: Not on file    Inability: Not on file  . Transportation needs:    Medical: Not on file    Non-medical: Not on file  Tobacco Use  . Smoking status: Former Smoker    Packs/day: 1.00    Years: 2.00    Pack years: 2.00    Last attempt to quit: 06/30/1959    Years since quitting: 58.6  . Smokeless tobacco: Never Used  Substance and Sexual Activity  . Alcohol use: Not Currently  . Drug use: No  . Sexual activity: Not on file  Lifestyle  . Physical activity:    Days per week: Not on file    Minutes per session: Not on file  . Stress: Not on file  Relationships  . Social connections:    Talks on phone: Not on file    Gets together: Not on file    Attends religious service: Not on file    Active  member of club or organization: Not on file    Attends meetings of clubs or organizations: Not on file    Relationship status: Not on file  . Intimate partner violence:    Fear of current or ex partner: Not on file    Emotionally abused: Not on file    Physically abused: Not on file    Forced sexual activity: Not on file  Other Topics Concern  . Not on file  Social History Narrative  . Not on file    Past Medical History:  Diagnosis Date  . Hypertension      Patient Active Problem List     Diagnosis Date Noted  . Constipation 04/02/2017  . Ocular migraine 07/02/2016  . Transient ischemic attack 05/22/2015  . Hypercholesteremia 01/15/2015  . Essential hypertension 01/15/2015  . Polyp of nasal sinus 01/15/2015  . Apnea, sleep 01/15/2015    Past Surgical History:  Procedure Laterality Date  . ABDOMINAL HYSTERECTOMY  2006  . APPENDECTOMY    . BREAST SURGERY Right 1985   lumpectomy x 2  . BREAST SURGERY Right 1996   milk duct removal  . DILATION AND CURETTAGE OF UTERUS    . EYE SURGERY Bilateral    cataract/ glaucoma  . HEMORROIDECTOMY      Her family history includes Aneurysm in her father; Arthritis in her mother; COPD in her brother; Cancer in her brother and father; Colon cancer in her maternal grandfather; Coronary artery disease in her brother; Diabetes in her brother, maternal grandmother, and son; Early death in her brother; Heart attack in her brother; Hypertension in her brother and mother; Pneumonia in her brother; Stroke in her mother. There is no history of Breast cancer.      Current Outpatient Medications:  .  aspirin 81 MG tablet, Take by mouth., Disp: , Rfl:  .  MAGNESIUM PO, Magnesium 250-400mg  daily for migraine headaches, Disp: , Rfl:  .  Multiple Vitamins-Minerals (ICAPS AREDS 2 PO), Take by mouth., Disp: , Rfl:  .  OVER THE COUNTER MEDICATION, [place], Disp: , Rfl:  .  triamterene-hydrochlorothiazide (MAXZIDE-25) 37.5-25 MG tablet, Take 1 tablet by mouth daily., Disp: 90 tablet, Rfl: 3 .  TURMERIC PO, Take by mouth. With ginger, Disp: , Rfl:   Patient Care Team: Virginia Crews, MD as PCP - General (Family Medicine)     Objective:   Vitals: BP (!) 150/84 (BP Location: Left Arm, Patient Position: Sitting, Cuff Size: Small)   Pulse 73   Temp 98.1 F (36.7 C) (Oral)   Resp 16   Ht 5' (1.524 m)   Wt 97 lb 6.4 oz (44.2 kg)   SpO2 99%   BMI 19.02 kg/m   Physical Exam  Constitutional: [name] oriented to person, place, and time. She  appears well-developed and well-nourished. No distress.  HENT:  Head: Normocephalic and atraumatic.  Right Ear: External ear normal.  Left Ear: External ear normal.  Nose: Nose normal.  Mouth/Throat: Oropharynx is clear and moist.  Eyes: Pupils are equal, round, and reactive to light. Conjunctivae and EOM are normal. No scleral icterus.  Neck: Neck supple. No thyromegaly present.  Cardiovascular: Normal rate, regular rhythm, normal heart sounds and intact distal pulses.  No murmur heard. Pulmonary/Chest: Effort normal and breath sounds normal. No respiratory distress. She has no wheezes. She has no rales.  Abdominal: Soft. Bowel sounds are normal. She exhibits no distension. There is no tenderness. There is no rebound and no guarding.  Musculoskeletal: She  exhibits no edema or deformity.  Lymphadenopathy:    She has no cervical adenopathy.  Neurological: [name] alert and oriented to person, place, and time.  Skin: Skin is warm and dry. Capillary refill takes less than 2 seconds. No rash noted.  Psychiatric: She has a normal mood and affect. Her behavior is normal.  Vitals reviewed.   Activities of Daily Living In your present state of health, do you have any difficulty performing the following activities: 02/19/2018 01/19/2018  Hearing? N N  Vision? N N  Difficulty concentrating or making decisions? N N  Walking or climbing stairs? N N  Dressing or bathing? N N  Doing errands, shopping? N N  Some recent data might be hidden    Fall Risk Assessment Fall Risk  02/19/2018 01/19/2018 05/28/2015  Falls in the past year? No No Yes  Number falls in past yr: - - 1  Injury with Fall? - - No     Depression Screen PHQ 2/9 Scores 02/19/2018 01/19/2018 05/28/2015  PHQ - 2 Score 0 0 0    6CIT Screen 02/19/2018  What Year? 0 points  What month? 0 points  What time? 0 points  Count back from 20 0 points  Months in reverse 0 points  Repeat phrase 2 points  Total Score 2     Assessment  & Plan:     Annual Wellness Visit  Reviewed patient's Family Medical History Reviewed and updated list of patient's medical providers Assessment of cognitive impairment was done Assessed patient's functional ability Established a written schedule for health screening [place] Completed and Reviewed  Exercise Activities and Dietary recommendations Goals    . Exercise 150 minutes per week (moderate activity)       Immunization History  Administered Date(s) Administered  . Tdap 04/18/2011    Health Maintenance  Topic Date Due  . DEXA SCAN  05/06/2005  . PNA vac Low Risk Adult (1 of 2 - PCV13) 05/06/2005  . INFLUENZA VACCINE  01/28/2018  . TETANUS/TDAP  04/17/2021     Discussed health benefits of physical activity, and encouraged her to engage in regular exercise appropriate for her age and condition.    ------------------------------------------------------------------------------------------------------------ Problem List Items Addressed This Visit      Cardiovascular and Mediastinum   Essential hypertension    Well controlled on home readings Has white coat HTN Continue Maxide 25 Follow-up in 6 months Reviewed recent CMP       Other Visit Diagnoses    Medicare annual wellness visit, subsequent    -  Primary       Return in about 6 months (around 08/22/2018) for chronic disease f/u.   The entirety of the information documented in the History of Present Illness, Review of Systems and Physical Exam were personally obtained by me. Portions of this information were initially documented by Raquel Sarna Ratchford, CMA and reviewed by me for thoroughness and accuracy.    Virginia Crews, MD, MPH [facility] 02/19/2018 1:34 PM

## 2018-04-09 DIAGNOSIS — H353132 Nonexudative age-related macular degeneration, bilateral, intermediate dry stage: Secondary | ICD-10-CM | POA: Diagnosis not present

## 2018-08-23 ENCOUNTER — Ambulatory Visit: Payer: Self-pay | Admitting: Family Medicine

## 2018-08-23 NOTE — Progress Notes (Deleted)
Patient: Mariah Levy Female    DOB: 03-28-1941   78 y.o.   MRN: [id number] Visit Date: 08/23/2018  Today's Provider: Lavon Paganini, MD   No chief complaint on file.  Subjective:     HPI  Hypertension, follow-up:  BP Readings from Last 3 Encounters:  02/19/18 (!) 150/84  01/19/18 136/84  04/02/17 138/78   She was last seen for hypertension more than 6 months ago.  BP at that visit was 150/84 Management since that visit includes decreasing Maxide dose to 37.5-25 mg daily. She reports good compliance with treatment. [name] not having side effects.  [name] exercising. Stays active. [name] not adherent to low salt diet.   Outside blood pressures are "always very good." Recently 116/67. [name].  She feels like [name] gets low when she is up and about a lot. Patient denies chest pain, chest pressure/discomfort, claudication, dyspnea, exertional chest pressure/discomfort, irregular heart beat, lower extremity edema, near-syncope, orthopnea, palpitations and syncope.   Cardiovascular risk factors include advanced age (older than 57 for men, 77 for women), dyslipidemia and hypertension.  Use of agents associated with hypertension: none.                Weight trend:     Wt Readings from Last 3 Encounters:  01/19/18 97 lb 12.8 oz (44.4 kg)  04/02/17 101 lb (45.8 kg)  12/04/16 103 lb (46.7 kg)    Current diet: in general, a "healthy" diet    ------------------------------------------------------------------------  Lipid/Cholesterol, Follow-up:   Last seen for this over 6 months ago. Management changes since that visit include working on lifestyle changes.  Last Lipid Panel: Lab Results  Component Value Date   CHOL 260 (H) 01/21/2018   HDL 83 01/21/2018   LDLCALC 167 (H) 01/21/2018   TRIG 49 01/21/2018   CHOLHDL 3.1 01/21/2018   Risk factors for vascular disease include cerebral vascular disease, hypercholesterolemia and  hypertension -------------------------------------------------------------------   Allergies  Allergen Reactions  . Atacand  [Candesartan] Shortness Of Breath  . Iodine Shortness Of Breath  . Other Anaphylaxis    Melons of any kind.  . Demerol [Meperidine] Hives  . Propoxyphene Hives    Darvon, Darvocet  . Ramipril Swelling    Paresthesias.  . Penicillins Rash     Current Outpatient Medications:  .  aspirin 81 MG tablet, Take by mouth., Disp: , Rfl:  .  MAGNESIUM PO, Magnesium 250-400mg  daily for migraine headaches, Disp: , Rfl:  .  Multiple Vitamins-Minerals (ICAPS AREDS 2 PO), Take by mouth., Disp: , Rfl:  .  OVER THE COUNTER MEDICATION, [place], Disp: , Rfl:  .  triamterene-hydrochlorothiazide (MAXZIDE-25) 37.5-25 MG tablet, Take 1 tablet by mouth daily., Disp: 90 tablet, Rfl: 3 .  TURMERIC PO, Take by mouth. With ginger, Disp: , Rfl:   Review of Systems  Constitutional: Negative.   Respiratory: Negative.   Cardiovascular: Negative.   Musculoskeletal: Negative.     Social History   Tobacco Use  . Smoking status: Former Smoker    Packs/day: 1.00    Years: 2.00    Pack years: 2.00    Last attempt to quit: 06/30/1959    Years since quitting: 59.1  . Smokeless tobacco: Never Used  Substance Use Topics  . Alcohol use: Not Currently      Objective:   There were no vitals taken for this visit. There were no vitals filed for this visit.   Physical Exam  Assessment & Plan        Lavon Paganini, MD  [facility]

## 2018-08-30 ENCOUNTER — Other Ambulatory Visit: Payer: Self-pay

## 2018-08-30 ENCOUNTER — Telehealth: Payer: Self-pay

## 2018-08-30 ENCOUNTER — Encounter: Payer: Self-pay | Admitting: Emergency Medicine

## 2018-08-30 ENCOUNTER — Emergency Department
Admission: EM | Admit: 2018-08-30 | Discharge: 2018-08-30 | Disposition: A | Discharge status: Home / Self Care | Payer: MEDICARE | Attending: Emergency Medicine | Admitting: Emergency Medicine

## 2018-08-30 DIAGNOSIS — R040 Epistaxis: Secondary | ICD-10-CM

## 2018-08-30 DIAGNOSIS — Z79899 Other long term (current) drug therapy: Secondary | ICD-10-CM | POA: Diagnosis not present

## 2018-08-30 DIAGNOSIS — Z87891 Personal history of nicotine dependence: Secondary | ICD-10-CM | POA: Diagnosis not present

## 2018-08-30 DIAGNOSIS — Z880 Allergy status to penicillin: Secondary | ICD-10-CM | POA: Insufficient documentation

## 2018-08-30 DIAGNOSIS — I10 Essential (primary) hypertension: Secondary | ICD-10-CM | POA: Diagnosis not present

## 2018-08-30 DIAGNOSIS — Z7982 Long term (current) use of aspirin: Secondary | ICD-10-CM | POA: Diagnosis not present

## 2018-08-30 MED ORDER — FLUTICASONE PROPIONATE 50 MCG/ACT NA SUSP: 1.0000 | Freq: Two times a day (BID) | NASAL | 0 refills | Status: DC

## 2018-08-30 MED ORDER — OXYMETAZOLINE HCL 0.05 % NA SOLN: 2.0000 | Freq: Two times a day (BID) | NASAL | 0 refills | Status: DC | PRN

## 2018-08-30 NOTE — Telephone Encounter (Signed)
Patient's son Jearld Fenton called stating that patient called  EMS today because she has been having nose bleeds. EMS is currently still with the patient and her nose bleeds have stopped. Her blood pressure was 190/80 when EMS arrived, and currently it is 181/90. Patient is anxious and doesn't want to go to the ER. Jearld Fenton states that she has been taking the Triamterene/ HCTZ as prescribed. I consulted with Dr. Brita Romp who recommends that patient go to the ER for evaluation since her blood pressure was so high and there were no open appointments toady. Bernie verbalized understanding and agreed to take patient to the ER.

## 2018-08-30 NOTE — ED Triage Notes (Signed)
States had nosebleed approx 1 hour ago while at store. Has stopped now.

## 2018-08-30 NOTE — ED Notes (Signed)
See triage note  Presents with nose bleed  States she has had 3 nosebleeds recently  Denies any trauma  And states she does not take blood thinners  No bleeding noted at present

## 2018-08-30 NOTE — ED Provider Notes (Signed)
[facility] Emergency Department Provider Note  ____________________________________________  Time seen: Approximately 4:51 PM  I have reviewed the triage vital signs and the nursing notes.   HISTORY  Chief Complaint Epistaxis    HPI Mariah Levy is a 78 y.o. female who presents emergency department concern for recurrent epistaxis.  Patient reports that she developed nontraumatic epistaxis 4 days ago.  Since then, she reports that anytime she bends over or lifts an item she will have a return of epistaxis.  Patient reports that as long as she pinches the nasal bridge, leans forward this rapidly resolves.  Patient denies any other complaints of recent URI symptoms, allergic rhinitis history or symptoms, trauma to the nose or face, bleeding disorders, nonspecific bleeding, bruising or rashes.  No medications for his complaint prior to arrival.  No other complaints at this time other than epistaxis.  Patient has a history of hypertension and a past medical history of TIA.  Patient takes a daily aspirin as well as antihypertensives.  Patient reports that her blood pressure is typically well controlled of 130s over 70s.    Past Medical History:  Diagnosis Date  . Hypertension     Patient Active Problem List   Diagnosis Date Noted  . Constipation 04/02/2017  . Ocular migraine 07/02/2016  . Transient ischemic attack 05/22/2015  . Hypercholesteremia 01/15/2015  . Essential hypertension 01/15/2015  . Polyp of nasal sinus 01/15/2015  . Apnea, sleep 01/15/2015    Past Surgical History:  Procedure Laterality Date  . ABDOMINAL HYSTERECTOMY  2006  . APPENDECTOMY    . BREAST SURGERY Right 1985   lumpectomy x 2  . BREAST SURGERY Right 1996   milk duct removal  . DILATION AND CURETTAGE OF UTERUS    . EYE SURGERY Bilateral    cataract/ glaucoma  . HEMORROIDECTOMY      Prior to Admission medications   Medication Sig Start Date End Date Taking? Authorizing  Provider  aspirin 81 MG tablet Take by mouth. 04/18/11   [provider]  fluticasone (FLONASE) 50 MCG/ACT nasal spray Place 1 spray into both nostrils 2 (two) times daily. 08/30/18   Cuthriell, Charline Bills, PA-C  MAGNESIUM PO Magnesium 250-400mg  daily for migraine headaches 09/15/16   [provider]  Multiple Vitamins-Minerals (ICAPS AREDS 2 PO) Take by mouth.    [provider]  OVER THE COUNTER MEDICATION [place]    [provider]  oxymetazoline (AFRIN) 0.05 % nasal spray Place 2 sprays into both nostrils 2 (two) times daily as needed (epistaxis). 08/30/18   Cuthriell, Charline Bills, PA-C  triamterene-hydrochlorothiazide (MAXZIDE-25) 37.5-25 MG tablet Take 1 tablet by mouth daily. 01/19/18   Virginia Crews, MD  TURMERIC PO Take by mouth. With ginger    [provider]    Allergies Atacand  [candesartan]; Iodine; Other; Demerol [meperidine]; Propoxyphene; Ramipril; and Penicillins  Family History  Problem Relation Age of Onset  . Hypertension Mother   . Arthritis Mother   . Stroke Mother   . Cancer Father        unknown origin  . Aneurysm Father   . Hypertension Brother   . Diabetes Brother   . Coronary artery disease Brother   . Heart attack Brother   . COPD Brother   . Cancer Brother        lung  . Early death Brother        Died as infant  . Pneumonia Brother   . Diabetes Son   .  Diabetes Maternal Grandmother   . Colon cancer Maternal Grandfather   . Breast cancer Neg Hx     Social History Social History   Tobacco Use  . Smoking status: Former Smoker    Packs/day: 1.00    Years: 2.00    Pack years: 2.00    Last attempt to quit: 06/30/1959    Years since quitting: 59.2  . Smokeless tobacco: Never Used  Substance Use Topics  . Alcohol use: Not Currently  . Drug use: No     Review of Systems  Constitutional: No fever/chills Eyes: No visual changes. No discharge ENT: Positive for 4-day history of recurrent  epistaxis. Cardiovascular: no chest pain. Respiratory: no cough. No SOB. Gastrointestinal: No abdominal pain.  No nausea, no vomiting.  No diarrhea.  No constipation. Musculoskeletal: Negative for musculoskeletal pain. Skin: Negative for rash, abrasions, lacerations, ecchymosis. Neurological: Negative for headaches, focal weakness or numbness. 10-point ROS otherwise negative.  ____________________________________________   PHYSICAL EXAM:  VITAL SIGNS: ED Triage Vitals  Enc Vitals Group     BP 08/30/18 1557 (!) 165/6     Pulse Rate 08/30/18 1557 89     Resp 08/30/18 1557 20     Temp 08/30/18 1557 97.7 F (36.5 C)     Temp Source 08/30/18 1557 Oral     SpO2 08/30/18 1557 100 %     Weight 08/30/18 1558 95 lb (43.1 kg)     Height 08/30/18 1558 5' (1.524 m)     Head Circumference --      Peak Flow --      Pain Score 08/30/18 1558 0     Pain Loc --      Pain Edu? --      Excl. in [place]? --      Constitutional: Alert and oriented. Well appearing and in no acute distress. Eyes: Conjunctivae are normal. PERRL. EOMI. Head: Atraumatic. ENT:      Ears:       Nose: No congestion/rhinnorhea.  Visualization of the bilateral nares reveals scabbing over the [place]essel[place] plexus bilaterally.  No active epistaxis.  Some congealed blood is appreciated in the left nares.  Patient has no other visible abnormality to include turbinates, septum.  No tenderness to palpation of the external nasal bridge.      Mouth/Throat: Mucous membranes are moist.  Oropharynx is nonerythematous and nonedematous. Neck: No stridor.   ematological/Lymphatic/Immunilogical: No cervical lymphadenopathy. Cardiovascular: Normal rate, regular rhythm. Normal S1 and S2.  Good peripheral circulation. Respiratory: Normal respiratory effort without tachypnea or retractions. Lungs CTAB. Good air entry to the bases with no decreased or absent breath sounds. Musculoskeletal: Full range of motion to all extremities. No gross  deformities appreciated. Neurologic:  Normal speech and language. No gross focal neurologic deficits are appreciated.  Skin:  Skin is warm, dry and intact. No rash noted. Psychiatric: Mood and affect are normal. Speech and behavior are normal. Patient exhibits appropriate insight and judgement.   ____________________________________________   LABS (all labs ordered are listed, but only abnormal results are displayed)  Labs Reviewed - No data to display ____________________________________________  EKG   ____________________________________________  RADIOLOGY   No results found.  ____________________________________________    PROCEDURES  Procedure(s) performed:    Procedures    Medications - No data to display   ____________________________________________   INITIAL IMPRESSION / ASSESSMENT AND PLAN / ED COURSE  Pertinent labs & imaging results that were available during my care of the patient were reviewed by me and  considered in my medical decision making (see chart for details).  Review of the [place] CSRS was performed in accordance of the [place] prior to dispensing any controlled drugs.      Patient's diagnosis is consistent with epistaxis.  Patient presents emergency department with a concern for recurrent epistaxis over the past 4 days.  Patient had nontraumatic epistaxis 4 days ago and has since had return of epistaxis with lifting items or bending over.  Patient is able to easily control bleeding with direct pressure and leaning forward.  At this time, exam is reassuring with findings consistent with mild distal scabbing.  No indication of anterior nosebleed.  At this time, no indication for packing.  I discussed labs to include CBC for blood count but patient declines at this time which I feel is reasonable.  At this time patient will be treated with Flonase and Afrin and follow-up with ENT if symptoms do not improve.  Patient verbalizes understanding of same..   Patient may follow-up with primary care if needed.  Patient is given ED precautions to return to the ED for any worsening or new symptoms.     ____________________________________________  FINAL CLINICAL IMPRESSION(S) / ED DIAGNOSES  Final diagnoses:  Epistaxis      NEW MEDICATIONS STARTED DURING THIS VISIT:  ED Discharge Orders         Ordered    fluticasone (FLONASE) 50 MCG/ACT nasal spray  2 times daily     08/30/18 1714    oxymetazoline (AFRIN) 0.05 % nasal spray  2 times daily PRN     08/30/18 1714              This chart was dictated using voice recognition software/Dragon. Despite best efforts to proofread, errors can occur which can change the meaning. Any change was purely unintentional.    Darletta Moll, PA-C 08/30/18 1718    Eula Listen, MD 08/30/18 2312

## 2018-09-06 DIAGNOSIS — J342 Deviated nasal septum: Secondary | ICD-10-CM | POA: Diagnosis not present

## 2018-09-06 DIAGNOSIS — J3489 Other specified disorders of nose and nasal sinuses: Secondary | ICD-10-CM | POA: Diagnosis not present

## 2018-10-05 IMAGING — CR DG CERVICAL SPINE COMPLETE 4+V
1 series · 6 of 6 positions shown · non-contrast
Comparison: None

CLINICAL DATA: Posterior neck pain, left side neck pain, headache
for 2 days

EXAM:
CERVICAL SPINE - COMPLETE 4+ VIEW

[Series 1: dg cervical spine complete · 0.14mm/px · 6 of 6 slices shown]
[im 1/6]
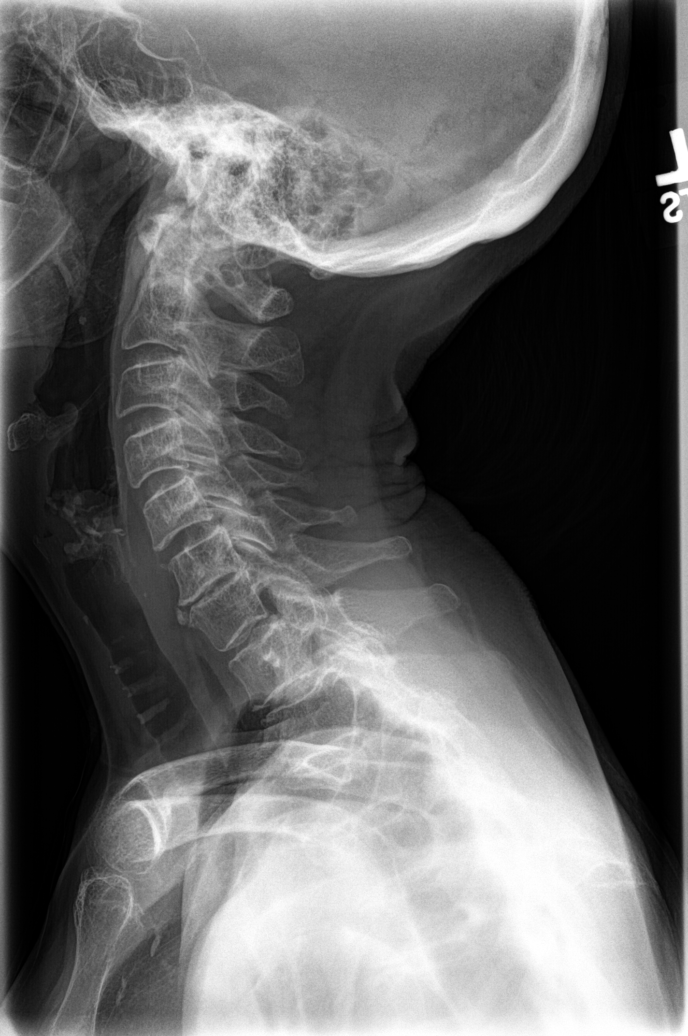
[im 2/6]
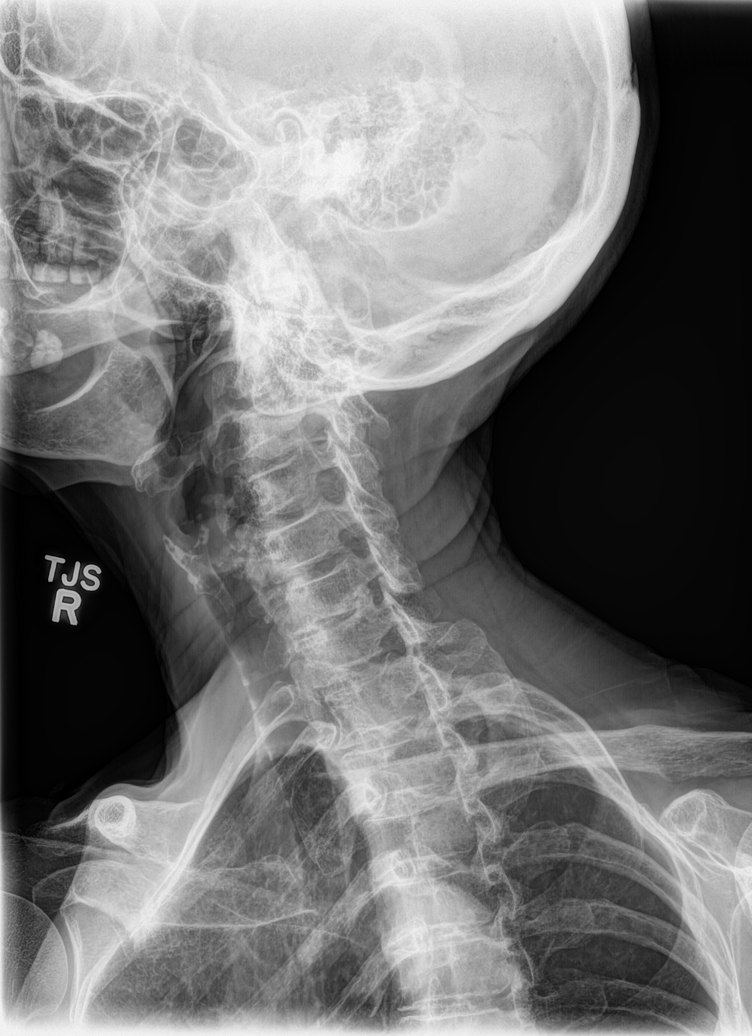
[im 3/6]
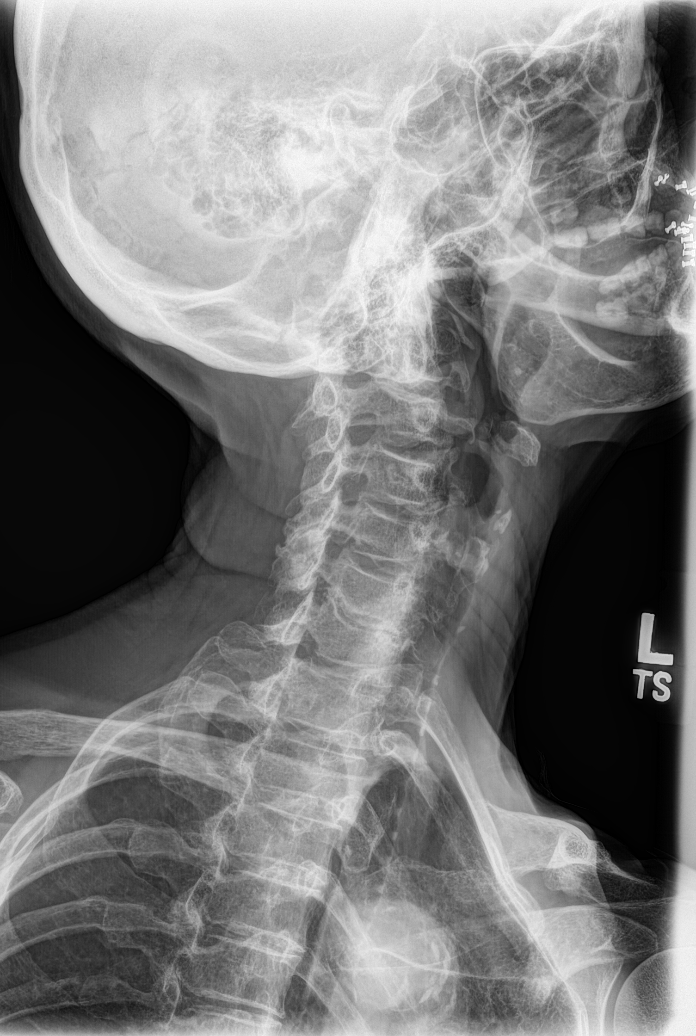
[im 4/6]
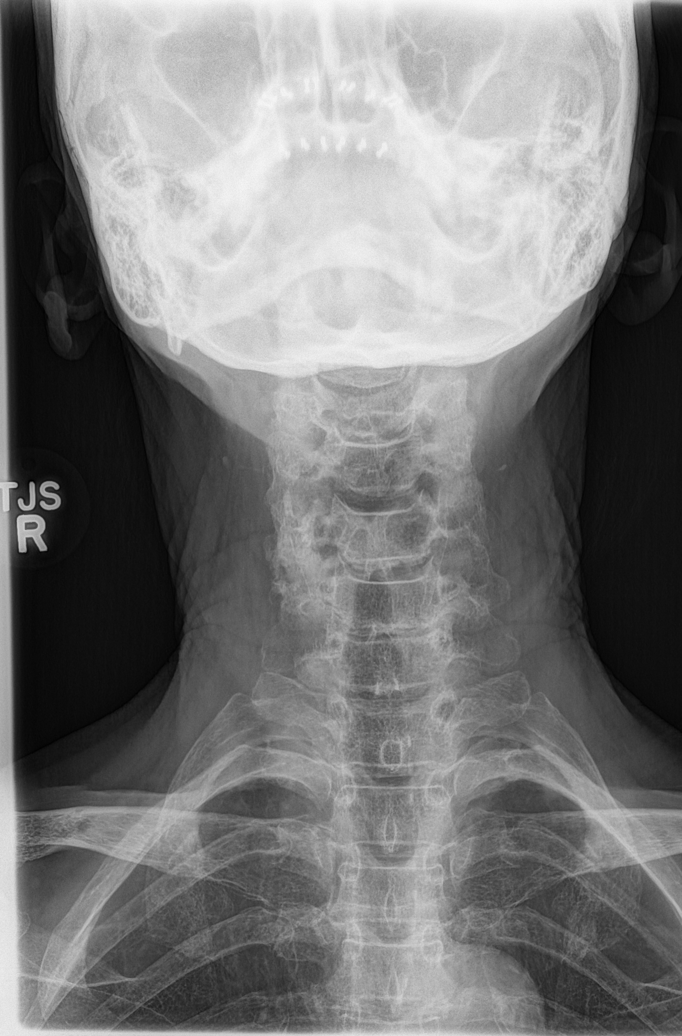
[im 5/6]
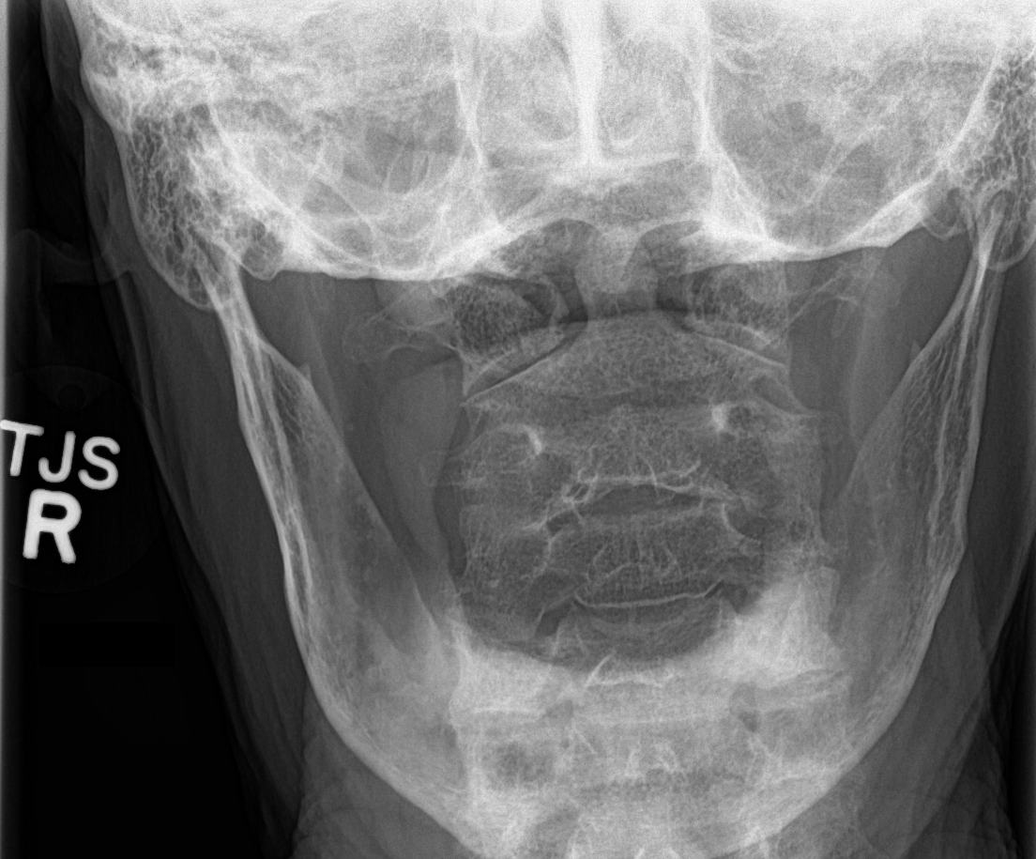
[im 6/6]
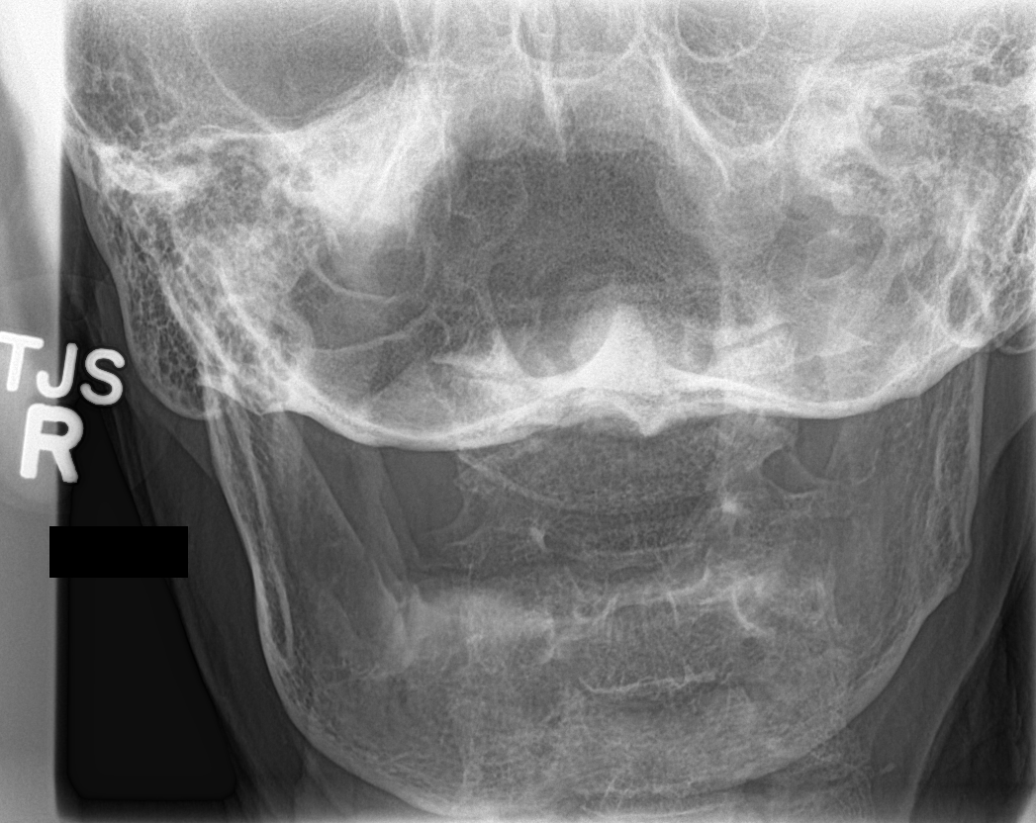

[6 of 6 positions shown; findings below may reference images not displayed]

FINDINGS: Six views of the cervical spine submitted. No acute fracture or
subluxation. Mild degenerative changes C1-C2 articulation. No
prevertebral soft tissue swelling. There is moderate disc space
flattening with mild anterior spurring at C6-C7 level. Mild disc
space flattening at C7-T1 and T1-T2 level.

Cervical airway is patent.
IMPRESSION: No acute fracture or subluxation. Degenerative changes as described
above most significant at C6-C7 and C7-T1 level.

## 2019-03-28 ENCOUNTER — Other Ambulatory Visit: Payer: Self-pay | Admitting: Family Medicine

## 2019-03-28 NOTE — Telephone Encounter (Signed)
1 month supply sent. Patient needs f/u appt (has not been seen in >1 yr) and AWV

## 2019-03-28 NOTE — Telephone Encounter (Signed)
Patient declined a in office visit due to possible COVID exposure. Virtual visit scheduled for 04/13/2019

## 2019-04-13 ENCOUNTER — Ambulatory Visit (INDEPENDENT_AMBULATORY_CARE_PROVIDER_SITE_OTHER): Payer: MEDICARE | Admitting: Family Medicine

## 2019-04-13 ENCOUNTER — Encounter: Payer: Self-pay | Admitting: Family Medicine

## 2019-04-13 VITALS — BP 119/69 | HR 62 | Wt 91.0 lb

## 2019-04-13 DIAGNOSIS — I10 Essential (primary) hypertension: Secondary | ICD-10-CM

## 2019-04-13 DIAGNOSIS — E7800 Pure hypercholesterolemia, unspecified: Secondary | ICD-10-CM

## 2019-04-13 DIAGNOSIS — E441 Mild protein-calorie malnutrition: Secondary | ICD-10-CM | POA: Insufficient documentation

## 2019-04-13 MED ORDER — TRIAMTERENE-HCTZ 37.5-25 MG PO TABS: 1.0000 | ORAL_TABLET | Freq: Every day | ORAL | 3 refills | Status: DC

## 2019-04-13 NOTE — Assessment & Plan Note (Signed)
Discussed low BMI of 17.77 Encouraged healthy diet and working on weight gain Consider nutritional supplement like Boost or Ensure

## 2019-04-13 NOTE — Progress Notes (Signed)
Patient: Mariah Levy Female    DOB: [birth year]   78 y.o.   MRN: [id number] Visit Date: 04/13/2019  Today's Provider: Lavon Paganini, MD   Chief Complaint  Patient presents with  . Hypertension   Subjective:    Virtual Visit via Telephone Note  I connected with Mariah Levy on 04/13/19 at  1:20 PM EDT by telephone and verified that I am speaking with the correct person using two identifiers.  Patient location: home Provider location: [place] involved in the visit: patient, provider   I discussed the limitations, risks, security and privacy concerns of performing an evaluation and management service by telephone and the availability of in person appointments. I also discussed with the patient that there may be a patient responsible charge related to this service. The patient expressed understanding and agreed to proceed.   HPI  Hypertension, follow-up:  BP Readings from Last 3 Encounters:  04/13/19 119/69  08/30/18 (!) 165/6  02/19/18 (!) 150/84    She was last seen for hypertension 1 years ago.  BP at that visit was 150/84. Management changes since that visit include no changes. She reports good compliance with treatment. [name] not having side effects. [name] exercising. [name] adherent to low salt diet.   Outside blood pressures are well controlled. [name] experiencing none.  Patient denies chest pain, dyspnea, exertional chest pressure/discomfort, irregular heart beat, lower extremity edema, palpitations and syncope.   Cardiovascular risk factors include advanced age (older than 7 for men, 12 for women), dyslipidemia and hypertension.  Use of agents associated with hypertension: none.     Weight trend: fluctuating a bit Wt Readings from Last 3 Encounters:  04/13/19 91 lb (41.3 kg)  08/30/18 95 lb (43.1 kg)  02/19/18 97 lb 6.4 oz (44.2 kg)    Current diet: in general, a "healthy" diet     She has started taking  magnesium and other natural supplements and states that her dizziness has resolved.   Had lost weight with husband's illness and was in the 80s.  She has been gaining weight back recently.  She states her appetite is normal. ------------------------------------------------------------------------    Allergies  Allergen Reactions  . Atacand  [Candesartan] Shortness Of Breath  . Iodine Shortness Of Breath  . Other Anaphylaxis    Melons of any kind.  . Demerol [Meperidine] Hives  . Propoxyphene Hives    Darvon, Darvocet  . Ramipril Swelling    Paresthesias.  . Penicillins Rash     Current Outpatient Medications:  Marland Kitchen  MAGNESIUM PO, Magnesium 250-400mg  daily for migraine headaches, Disp: , Rfl:  .  Multiple Vitamins-Minerals (ICAPS AREDS 2 PO), Take by mouth., Disp: , Rfl:  .  OVER THE COUNTER MEDICATION, [place], Disp: , Rfl:  .  triamterene-hydrochlorothiazide (MAXZIDE-25) 37.5-25 MG tablet, Take 1 tablet by mouth once daily, Disp: 30 tablet, Rfl: 0 .  TURMERIC PO, Take by mouth. With ginger, Disp: , Rfl:   Review of Systems  Constitutional: Negative.   Respiratory: Negative.   Genitourinary: Negative.   Neurological: Negative.     Social History   Tobacco Use  . Smoking status: Former Smoker    Packs/day: 1.00    Years: 2.00    Pack years: 2.00    Quit date: 06/30/1959    Years since quitting: 59.8  . Smokeless tobacco: Never Used  Substance Use Topics  . Alcohol use: Not Currently  Objective:   BP 119/69 (BP Location: Left Arm, Patient Position: Sitting, Cuff Size: Normal)   Pulse 62   Wt 91 lb (41.3 kg)   SpO2 98%   BMI 17.77 kg/m  Vitals:   04/13/19 1320  BP: 119/69  Pulse: 62  SpO2: 98%  Weight: 91 lb (41.3 kg)  Body mass index is 17.77 kg/m.   Physical Exam Patient speaking in full sentences with no apparent distress or respiratory distress  No results found for any visits on 04/13/19.     Assessment & Plan    I discussed the assessment  and treatment plan with the patient. The patient was provided an opportunity to ask questions and all were answered. The patient agreed with the plan and demonstrated an understanding of the instructions.   The patient was advised to call back or seek an in-person evaluation if the symptoms worsen or if the condition fails to improve as anticipated.  Problem List Items Addressed This Visit      Cardiovascular and Mediastinum   Essential hypertension - Primary    Well controlled on patient's home monitoring Patient declines rechecking labs at this time (due to pandemic) Will plan to recheck at next visit Recheck metabolic panel F/u in 6 months       Relevant Medications   triamterene-hydrochlorothiazide (MAXZIDE-25) 37.5-25 MG tablet     Other   Hypercholesteremia    Not currently on statin Patient declines rechecking labs at this time (due to pandemic) Will plan to recheck at next visit      Relevant Medications   triamterene-hydrochlorothiazide (MAXZIDE-25) 37.5-25 MG tablet   Malnutrition of mild degree ([place])    Discussed low BMI of 17.77 Encouraged healthy diet and working on weight gain Consider nutritional supplement like Boost or Ensure          Return in about 6 months (around 10/12/2019) for AWV, chronic disease f/u.   The entirety of the information documented in the History of Present Illness, Review of Systems and Physical Exam were personally obtained by me. Portions of this information were initially documented by [facility], CMA and reviewed by me for thoroughness and accuracy.    Coty Larsh, Dionne Bucy, MD MPH [facility]

## 2019-04-13 NOTE — Assessment & Plan Note (Signed)
Not currently on statin Patient declines rechecking labs at this time (due to pandemic) Will plan to recheck at next visit

## 2019-04-13 NOTE — Assessment & Plan Note (Addendum)
Well controlled on patient's home monitoring Patient declines rechecking labs at this time (due to pandemic) Will plan to recheck at next visit Recheck metabolic panel F/u in 6 months

## 2019-10-03 NOTE — Progress Notes (Signed)
Subjective:   Mariah Levy is a 79 y.o. female who presents for Medicare Annual (Subsequent) preventive examination.    This visit is being conducted through telemedicine due to the COVID-19 pandemic. This patient has given me verbal consent via doximity to conduct this visit, patient states they are participating from their home address. Some vital signs may be absent or patient reported.    Patient identification: identified by name, DOB, and current address  Review of Systems:  N/A  Cardiac Risk Factors include: advanced age (>43men, >73 women)     Objective:     Vitals: BP 110/72   Pulse 68   Temp 99 F (37.2 C) (Oral)   Wt 92 lb (41.7 kg)   SpO2 99%   BMI 17.97 kg/m   Body mass index is 17.97 kg/m. Unable to obtain vitals due to visit being conducted via telephonically. Pt recorded vitals at home.  Advanced Directives 10/04/2019 08/30/2018 09/05/2015 06/05/2015 06/03/2015 05/28/2015  Does Patient Have a Medical Advance Directive? No No Yes No No No  Type of Advance Directive - - [place]  Does patient want to make changes to medical advance directive? Yes (MAU/Ambulatory/Procedural Areas - Information given) - - - - -  Would patient like information on creating a medical advance directive? - No - Patient declined - - - -    Tobacco Social History   Tobacco Use  Smoking Status Former Smoker  . Packs/day: 1.00  . Years: 2.00  . Pack years: 2.00  . Quit date: 06/30/1959  . Years since quitting: 60.3  Smokeless Tobacco Never Used     Counseling given: Not Answered   Clinical Intake:  Pre-visit preparation completed: Yes  Pain : No/denies pain Pain Score: 0-No pain     Nutritional Risks: None Diabetes: No  How often do you need to have someone help you when you read instructions, pamphlets, or other written materials from your doctor or pharmacy?: 1 - Never  Interpreter Needed?: No  Information entered by :: [facility],  LPN  Past Medical History:  Diagnosis Date  . Hypertension    Past Surgical History:  Procedure Laterality Date  . ABDOMINAL HYSTERECTOMY  2006  . APPENDECTOMY    . BREAST SURGERY Right 1985   lumpectomy x 2  . BREAST SURGERY Right 1996   milk duct removal  . DILATION AND CURETTAGE OF UTERUS    . EYE SURGERY Bilateral    cataract/ glaucoma  . HEMORROIDECTOMY     Family History  Problem Relation Age of Onset  . Hypertension Mother   . Arthritis Mother   . Stroke Mother   . Cancer Father        unknown origin  . Aneurysm Father   . Hypertension Brother   . Diabetes Brother   . Coronary artery disease Brother   . Heart attack Brother   . COPD Brother   . Cancer Brother        lung  . Early death Brother        Died as infant  . Pneumonia Brother   . Diabetes Son   . Diabetes Maternal Grandmother   . Colon cancer Maternal Grandfather   . Breast cancer Neg Hx    Social History   Socioeconomic History  . Marital status: Widowed    Spouse name: Not on file  . Number of children: 4  . Years of education: H/S  . Highest education level: High  school graduate  Occupational History  . Occupation: Retired  Tobacco Use  . Smoking status: Former Smoker    Packs/day: 1.00    Years: 2.00    Pack years: 2.00    Quit date: 06/30/1959    Years since quitting: 60.3  . Smokeless tobacco: Never Used  Substance and Sexual Activity  . Alcohol use: Not Currently  . Drug use: No  . Sexual activity: Not on file  Other Topics Concern  . Not on file  Social History Narrative  . Not on file   Social Determinants of Health   Financial Resource Strain: Low Risk   . Difficulty of Paying Living Expenses: Not hard at all  Food Insecurity: No Food Insecurity  . Worried About Charity fundraiser in the Last Year: Never true  . Ran Out of Food in the Last Year: Never true  Transportation Needs: No Transportation Needs  . Lack of Transportation (Medical): No  . Lack of  Transportation (Non-Medical): No  Physical Activity: Inactive  . Days of Exercise per Week: 0 days  . Minutes of Exercise per Session: 0 min  Stress: No Stress Concern Present  . Feeling of Stress : Not at all  Social Connections: Somewhat Isolated  . Frequency of Communication with Friends and Family: Never  . Frequency of Social Gatherings with Friends and Family: Three times a week  . Attends Religious Services: More than 4 times per year  . Active Member of Clubs or Organizations: No  . Attends Archivist Meetings: Never  . Marital Status: Widowed    Outpatient Encounter Medications as of 10/04/2019  Medication Sig  . MAGNESIUM PO 2 (two) times daily.   Marland Kitchen OVER THE COUNTER MEDICATION daily. [place]   . triamterene-hydrochlorothiazide (MAXZIDE-25) 37.5-25 MG tablet Take 1 tablet by mouth daily.  . TURMERIC PO Take by mouth daily. With ginger or Curcumin  . Multiple Vitamins-Minerals (ICAPS AREDS 2 PO) Take by mouth daily.    No facility-administered encounter medications on file as of 10/04/2019.    Activities of Daily Living In your present state of health, do you have any difficulty performing the following activities: 10/04/2019 04/13/2019  Hearing? N N  Vision? N N  Difficulty concentrating or making decisions? N N  Walking or climbing stairs? N N  Dressing or bathing? N N  Doing errands, shopping? N N  Preparing Food and eating ? N -  Using the Toilet? N -  In the past six months, have you accidently leaked urine? Y -  Comment Rarely when lifting heavy objects or when waits. -  Do you have problems with loss of bowel control? N -  Managing your Medications? N -  Managing your Finances? N -  Housekeeping or managing your Housekeeping? N -  Some recent data might be hidden    Patient Care Team: Virginia Crews, MD as PCP - General (Family Medicine) Leandrew Koyanagi, MD as Referring Physician (Ophthalmology)    Assessment:   This is a routine  wellness examination for [facility].  Exercise Activities and Dietary recommendations Current Exercise Habits: The patient does not participate in regular exercise at present, Exercise limited by: None identified  Goals    . Exercise 150 minutes per week (moderate activity)       Fall Risk: Fall Risk  10/04/2019 02/19/2018 01/19/2018 05/28/2015  Falls in the past year? 1 No No Yes  Number falls in past yr: 1 - - 1  Comment Only due  to weakness from an illnes. - - -  Injury with Fall? 0 - - No  Follow up Falls prevention discussed - - -    FALL RISK PREVENTION PERTAINING TO THE HOME:  Any stairs in or around the home? No  If so, are there any without handrails? N/A  Home free of loose throw rugs in walkways, pet beds, electrical cords, etc? Yes  Adequate lighting in your home to reduce risk of falls? Yes   ASSISTIVE DEVICES UTILIZED TO PREVENT FALLS:  Life alert? No  Use of a cane, walker or w/c? No  Grab bars in the bathroom? Yes  Shower chair or bench in shower? No  Elevated toilet seat or a handicapped toilet? No    TIMED UP AND GO:  Was the test performed? No .    Depression Screen PHQ 2/9 Scores 10/04/2019 04/13/2019 02/19/2018 01/19/2018  PHQ - 2 Score 1 0 0 0  PHQ- 9 Score - 2 - -     Cognitive Function     6CIT Screen 02/19/2018  What Year? 0 points  What month? 0 points  What time? 0 points  Count back from 20 0 points  Months in reverse 0 points  Repeat phrase 2 points  Total Score 2    Immunization History  Administered Date(s) Administered  . Tdap 04/18/2011    Qualifies for Shingles Vaccine? Yes . Due for Shingrix. Pt has been advised to call insurance company to determine out of pocket expense. Advised may also receive vaccine at local pharmacy or Health Dept. Verbalized acceptance and understanding.  Tdap: Up to date  Flu Vaccine: N/A  Pneumococcal Vaccine: Due for Pneumococcal vaccine. Does the patient want to receive this vaccine today?  No .  Advised may receive this vaccine at local pharmacy or Health Dept. Aware to provide a copy of the vaccination record if obtained from local pharmacy or Health Dept. Verbalized acceptance and understanding.   Screening Tests Health Maintenance  Topic Date Due  . DEXA SCAN  10/03/2020 (Originally 04/18/2006)  . PNA vac Low Risk Adult (1 of 2 - PCV13) 10/03/2020 (Originally 05/06/2005)  . INFLUENZA VACCINE  01/29/2020  . TETANUS/TDAP  04/17/2021    Cancer Screenings:  Colorectal Screening: No longer required.   Mammogram: No longer required.   Bone Density: Completed 04/18/04. Results reflect OSTEOPOROSIS. Repeat every 2 years. Pt declined a DEXA referral at this time.  Lung Cancer Screening: (Low Dose CT Chest recommended if Age 80-80 years, 30 pack-year currently smoking OR have quit w/in 15years.) does not qualify.   Additional Screening:  Vision Screening: Recommended annual ophthalmology exams for early detection of glaucoma and other disorders of the eye.  Dental Screening: Recommended annual dental exams for proper oral hygiene  Community Resource Referral:  CRR required this visit?  No       Plan:  I have personally reviewed and addressed the Medicare Annual Wellness questionnaire and have noted the following in the patient's chart:  A. Medical and social history B. Use of alcohol, tobacco or illicit drugs  C. Current medications and supplements D. Functional ability and status E.  Nutritional status F.  Physical activity G. Advance directives H. List of other physicians I.  Hospitalizations, surgeries, and ER visits in previous 12 months J.  [place] such as hearing and vision if needed, cognitive and depression L. Referrals and appointments   In addition, I have reviewed and discussed with patient certain preventive protocols, quality metrics, and  best practice recommendations. A written personalized care plan for preventive services as well as general  preventive health recommendations were provided to patient. Nurse Health Advisor  Signed,    Alec Mcphee [place], [place]  [id number] Nurse Health Advisor   Nurse Notes: Pt declined a DEXA scan or future pneumonia vaccine.

## 2019-10-04 ENCOUNTER — Ambulatory Visit (INDEPENDENT_AMBULATORY_CARE_PROVIDER_SITE_OTHER): Payer: MEDICARE

## 2019-10-04 ENCOUNTER — Other Ambulatory Visit: Payer: Self-pay

## 2019-10-04 VITALS — BP 110/72 | HR 68 | Temp 99.0°F | Wt 92.0 lb

## 2019-10-04 DIAGNOSIS — Z0000 Encounter for general adult medical examination without abnormal findings: Secondary | ICD-10-CM

## 2019-10-04 NOTE — Patient Instructions (Signed)
Mariah Levy , Thank you for taking time to come for your Medicare Wellness Visit. I appreciate your ongoing commitment to your health goals. Please review the following plan we discussed and let me know if I can assist you in the future.   Screening recommendations/referrals: Colonoscopy: No longer required.  Mammogram: No longer required.  Bone Density: Currently due. Declined referral at this time. Recommended yearly ophthalmology/optometry visit for glaucoma screening and checkup Recommended yearly dental visit for hygiene and checkup  Vaccinations: Influenza vaccine: Pt declines today.  Pneumococcal vaccine: Pt declines today.  Tdap vaccine: Up to date Shingles vaccine: Pt declines today.     Advanced directives: Advance directive discussed with you today. I will mail you a copy for you to complete at home and have notarized. Once this is complete please bring a copy in to our office so we can scan it into your chart.  Conditions/risks identified: Recommend to start exercising at least 3 days a week for at least 30 minutes at a time.   Next appointment: None. Declined scheduling a follow up with PCP or an AWV for 2022 at this time.    Preventive Care 34 Years and Older, Female Preventive care refers to lifestyle choices and visits with your health care provider that can promote health and wellness. What does preventive care include?  A yearly physical exam. This is also called an annual well check.  Dental exams once or twice a year.  Routine eye exams. Ask your health care provider how often you should have your eyes checked.  Personal lifestyle choices, including:  Daily care of your teeth and gums.  Regular physical activity.  Eating a healthy diet.  Avoiding tobacco and drug use.  Limiting alcohol use.  Practicing safe sex.  Taking low-dose aspirin every day.  Taking vitamin and mineral supplements as recommended by your health care provider. What happens  during an annual well check? The services and screenings done by your health care provider during your annual well check will depend on your age, overall health, lifestyle risk factors, and family history of disease. Counseling  Your health care provider may ask you questions about your:  Alcohol use.  Tobacco use.  Drug use.  Emotional well-being.  Home and relationship well-being.  Sexual activity.  Eating habits.  History of falls.  Memory and ability to understand (cognition).  Work and work Statistician.  Reproductive health. Screening  You may have the following tests or measurements:  Height, weight, and BMI.  Blood pressure.  Lipid and cholesterol levels. These may be checked every 5 years, or more frequently if you are over 67 years old.  Skin check.  Lung cancer screening. You may have this screening every year starting at age 73 if you have a 30-pack-year history of smoking and currently smoke or have quit within the past 15 years.  Fecal occult blood test (FOBT) of the stool. You may have this test every year starting at age 24.  Flexible sigmoidoscopy or colonoscopy. You may have a sigmoidoscopy every 5 years or a colonoscopy every 10 years starting at age 82.  Hepatitis C blood test.  Hepatitis B blood test.  Sexually transmitted disease (STD) testing.  Diabetes screening. This is done by checking your blood sugar (glucose) after you have not eaten for a while (fasting). You may have this done every 1-3 years.  Bone density scan. This is done to screen for osteoporosis. You may have this done starting at age 48.  Mammogram. This may be done every 1-2 years. Talk to your health care provider about how often you should have regular mammograms. Talk with your health care provider about your test results, treatment options, and if necessary, the need for more tests. Vaccines  Your health care provider may recommend certain vaccines, such  as:  Influenza vaccine. This is recommended every year.  Tetanus, diphtheria, and acellular pertussis (Tdap, Td) vaccine. You may need a Td booster every 10 years.  Zoster vaccine. You may need this after age 41.  Pneumococcal 13-valent conjugate (PCV13) vaccine. One dose is recommended after age 60.  Pneumococcal polysaccharide (PPSV23) vaccine. One dose is recommended after age 75. Talk to your health care provider about which screenings and vaccines you need and how often you need them. This information is not intended to replace advice given to you by your health care provider. Make sure you discuss any questions you have with your health care provider. Document Released: 07/13/2015 Document Revised: 03/05/2016 Document Reviewed: 04/17/2015 Elsevier Interactive Patient Education  2017 [employer] Prevention in the Home Falls can cause injuries. They can happen to people of all ages. There are many things you can do to make your home safe and to help prevent falls. What can I do on the outside of my home?  Regularly fix the edges of walkways and driveways and fix any cracks.  Remove anything that might make you trip as you walk through a door, such as a raised step or threshold.  Trim any bushes or trees on the path to your home.  Use bright outdoor lighting.  Clear any walking paths of anything that might make someone trip, such as rocks or tools.  Regularly check to see if handrails are loose or broken. Make sure that both sides of any steps have handrails.  Any raised decks and porches should have guardrails on the edges.  Have any leaves, snow, or ice cleared regularly.  Use sand or salt on walking paths during winter.  Clean up any spills in your garage right away. This includes oil or grease spills. What can I do in the bathroom?  Use night lights.  Install grab bars by the toilet and in the tub and shower. Do not use towel bars as grab bars.  Use  non-skid mats or decals in the tub or shower.  If you need to sit down in the shower, use a plastic, non-slip stool.  Keep the floor dry. Clean up any water that spills on the floor as soon as it happens.  Remove soap buildup in the tub or shower regularly.  Attach bath mats securely with double-sided non-slip rug tape.  Do not have throw rugs and other things on the floor that can make you trip. What can I do in the bedroom?  Use night lights.  Make sure that you have a light by your bed that is easy to reach.  Do not use any sheets or blankets that are too big for your bed. They should not hang down onto the floor.  Have a firm chair that has side arms. You can use this for support while you get dressed.  Do not have throw rugs and other things on the floor that can make you trip. What can I do in the kitchen?  Clean up any spills right away.  Avoid walking on wet floors.  Keep items that you use a lot in easy-to-reach places.  If you need to reach  something above you, use a strong step stool that has a grab bar.  Keep electrical cords out of the way.  Do not use floor polish or wax that makes floors slippery. If you must use wax, use non-skid floor wax.  Do not have throw rugs and other things on the floor that can make you trip. What can I do with my stairs?  Do not leave any items on the stairs.  Make sure that there are handrails on both sides of the stairs and use them. Fix handrails that are broken or loose. Make sure that handrails are as long as the stairways.  Check any carpeting to make sure that it is firmly attached to the stairs. Fix any carpet that is loose or worn.  Avoid having throw rugs at the top or bottom of the stairs. If you do have throw rugs, attach them to the floor with carpet tape.  Make sure that you have a light switch at the top of the stairs and the bottom of the stairs. If you do not have them, ask someone to add them for you. What  else can I do to help prevent falls?  Wear shoes that:  Do not have high heels.  Have rubber bottoms.  Are comfortable and fit you well.  Are closed at the toe. Do not wear sandals.  If you use a stepladder:  Make sure that it is fully opened. Do not climb a closed stepladder.  Make sure that both sides of the stepladder are locked into place.  Ask someone to hold it for you, if possible.  Clearly mark and make sure that you can see:  Any grab bars or handrails.  First and last steps.  Where the edge of each step is.  Use tools that help you move around (mobility aids) if they are needed. These include:  Canes.  Walkers.  Scooters.  Crutches.  Turn on the lights when you go into a dark area. Replace any light bulbs as soon as they burn out.  Set up your furniture so you have a clear path. Avoid moving your furniture around.  If any of your floors are uneven, fix them.  If there are any pets around you, be aware of where they are.  Review your medicines with your doctor. Some medicines can make you feel dizzy. This can increase your chance of falling. Ask your doctor what other things that you can do to help prevent falls. This information is not intended to replace advice given to you by your health care provider. Make sure you discuss any questions you have with your health care provider. Document Released: 04/12/2009 Document Revised: 11/22/2015 Document Reviewed: 07/21/2014 Elsevier Interactive Patient Education  2017 [employer].

## 2019-10-12 ENCOUNTER — Encounter: Payer: MEDICARE | Admitting: Family Medicine

## 2020-02-23 ENCOUNTER — Telehealth: Payer: Self-pay | Admitting: Family Medicine

## 2020-02-23 NOTE — Telephone Encounter (Signed)
Patient's appt in Oct  was being rescheduled.  Patient wanted to push the appointment out to after the 1st of the year.  I am letting someone know her appointment was made after the 1st of the year at her request.  Thanks, [facility]

## 2020-04-02 ENCOUNTER — Other Ambulatory Visit: Payer: Self-pay | Admitting: Family Medicine

## 2020-04-02 NOTE — Telephone Encounter (Signed)
Requested medication (s) are due for refill today:  Yes  Requested medication (s) are on the active medication list:  Yes  Future visit scheduled:  Yes  Last Refill: 04/13/19; #90/ refills x 3  Notes to clinic:  failed protocol due to labs outdated (last labs in 12/2017)  Requested Prescriptions  Pending Prescriptions Disp Refills   triamterene-hydrochlorothiazide (MAXZIDE-25) 37.5-25 MG tablet [Pharmacy Med Name: TRIAMTERENE-HCTZ 37.5-25 MG TB] 90 tablet 3    Sig: TAKE 1 TABLET BY MOUTH EVERY DAY      Cardiovascular: Diuretic Combos Failed - 04/02/2020  1:33 AM      Failed - K in normal range and within 360 days    Potassium  Date Value Ref Range Status  01/21/2018 3.9 3.5 - 5.2 mmol/L Final          Failed - Na in normal range and within 360 days    Sodium  Date Value Ref Range Status  01/21/2018 142 134 - 144 mmol/L Final          Failed - Cr in normal range and within 360 days    Creatinine, Ser  Date Value Ref Range Status  01/21/2018 0.86 0.57 - 1.00 mg/dL Final          Failed - Ca in normal range and within 360 days    Calcium  Date Value Ref Range Status  01/21/2018 9.7 8.7 - 10.3 mg/dL Final          Failed - Valid encounter within last 6 months    Recent Outpatient Visits           11 months ago Essential hypertension   [place], Dionne Bucy, MD   2 years ago Medicare annual wellness visit, subsequent   [facility], Dionne Bucy, MD   2 years ago Essential hypertension   [place], Dionne Bucy, MD   3 years ago Constipation, unspecified constipation type   [facility], Dionne Bucy, MD   3 years ago [place], [place]       Future Appointments             In 3 months Bacigalupo, Dionne Bucy, MD [facility]            Passed - Last BP in normal range    BP Readings from Last 1  Encounters:  10/04/19 110/72

## 2020-04-20 ENCOUNTER — Ambulatory Visit: Payer: MEDICARE | Admitting: Family Medicine

## 2020-06-04 ENCOUNTER — Ambulatory Visit (INDEPENDENT_AMBULATORY_CARE_PROVIDER_SITE_OTHER): Payer: MEDICARE | Admitting: Family Medicine

## 2020-06-04 ENCOUNTER — Other Ambulatory Visit: Payer: Self-pay

## 2020-06-04 ENCOUNTER — Ambulatory Visit
Admission: RE | Admit: 2020-06-04 | Discharge: 2020-06-04 | Disposition: A | Discharge status: Home / Self Care | Payer: MEDICARE | Source: Ambulatory Visit | Attending: Family Medicine | Admitting: Family Medicine

## 2020-06-04 ENCOUNTER — Encounter: Payer: Self-pay | Admitting: Family Medicine

## 2020-06-04 VITALS — BP 149/88 | HR 88 | Temp 98.5°F | Wt 91.0 lb

## 2020-06-04 DIAGNOSIS — I6782 Cerebral ischemia: Secondary | ICD-10-CM | POA: Diagnosis not present

## 2020-06-04 DIAGNOSIS — S0990XA Unspecified injury of head, initial encounter: Secondary | ICD-10-CM | POA: Diagnosis not present

## 2020-06-04 DIAGNOSIS — W19XXXA Unspecified fall, initial encounter: Secondary | ICD-10-CM | POA: Insufficient documentation

## 2020-06-04 DIAGNOSIS — I10 Essential (primary) hypertension: Secondary | ICD-10-CM | POA: Diagnosis not present

## 2020-06-04 DIAGNOSIS — R519 Headache, unspecified: Secondary | ICD-10-CM | POA: Diagnosis not present

## 2020-06-04 NOTE — Assessment & Plan Note (Signed)
[place] off the top step of a small ladder, injured head with no loss of consciousness 4 days later experienced feeling 'off', recording high home blood pressures systolic to 144R Neuro exam reassuring Alert and oriented Concern for insidious subdural hemorrhage Stat CT Head wo contrast

## 2020-06-04 NOTE — Patient Instructions (Signed)
Head Injury, Adult There are many types of head injuries. They can be as minor as a bump. Some head injuries can be worse. Worse injuries include:  A strong hit to the head that shakes the brain back and forth causing damage (concussion).  A bruise (contusion) of the brain. This means there is bleeding in the brain that can cause swelling.  A cracked skull (skull fracture).  Bleeding in the brain that gathers, gets thick (makes a clot), and forms a bump (hematoma). Most problems from a head injury come in the first 24 hours. However, you may still have side effects up to 7-10 days after your injury. It is important to watch your condition for any changes. You may need to be watched in the emergency department or urgent care, or you may need to stay in the hospital. What are the causes? There are many possible causes of a head injury. A serious head injury may be caused by:  A car accident.  Bicycle or motorcycle accidents.  Sports injuries.  Falls. What are the signs or symptoms? Symptoms of a head injury include a bruise, bump, or bleeding where the injury happened. Other physical symptoms may include:  Headache.  Feeling sick to your stomach (nauseous) or vomiting.  Dizziness.  Feeling tired.  Being uncomfortable around bright lights or loud noises.  Shaking movements that you cannot control (seizures).  Trouble being woken up.  Passing out (fainting). Mental or emotional symptoms may include:  Feeling grumpy or cranky.  Confusion and memory problems.  Having trouble paying attention or concentrating.  Changes in eating or sleeping habits.  Feeling worried or nervous (anxious).  Feeling sad (depressed). How is this treated? Treatment for this condition depends on how severe the injury is and the type of injury you have. The main goal is to prevent complications and to allow the brain time to heal. Mild head injury If you have a mild head injury, you may be  sent home and treatment may include:  Being watched. A responsible adult should stay with you for 24 hours after your injury and check on you often.  Physical rest.  Brain rest.  Pain medicines. Severe head injury If you have a severe head injury, treatment may include:  Being watched closely. This includes hospitalization with frequent physical exams.  Medicines to: ? Help with pain. ? Prevent shaking movements that you cannot control. ? Help with brain swelling.  Using a machine that helps you breathe (ventilator).  Treatments to manage the swelling inside the brain.  Brain surgery. This may be needed to: ? Remove a blood clot. ? Stop the bleeding. ? Remove a part of the skull. This allows room for the brain to swell. Follow these instructions at home: Activity  Rest.  Avoid activities that are hard or tiring.  Make sure you get enough sleep.  Limit activities that need a lot of thought or attention, such as: ? Watching TV. ? Playing memory games and puzzles. ? Job-related work or homework. ? Working on the computer, social media, and texting.  Avoid activities that could cause another head injury until your doctor says it is okay. This includes playing sports. Having another head injury, especially before the first one has healed, can be dangerous.  Ask your doctor when it is safe for you to go back to your normal activities, such as work or school. Ask your doctor for a step-by-step plan for slowly going back to your normal activities.  Ask   your doctor when you can drive, ride a bicycle, or use heavy machinery. Do not do these activities if you are dizzy. Lifestyle   Do not drink alcohol until your doctor says it is okay.  Do not use drugs.  If it is harder than usual to remember things, write them down.  If you are easily distracted, try to do one thing at a time.  Talk with family members or close friends when making important decisions.  Tell your  friends, family, a trusted coworker, and work manager about your injury, symptoms, and limits (restrictions). Have them watch for any problems that are new or getting worse. General instructions  Take over-the-counter and prescription medicines only as told by your doctor.  Have someone stay with you for 24 hours after your head injury. This person should watch you for any changes in your symptoms and be ready to get help.  Keep all follow-up visits as told by your doctor. This is important. How is this prevented?  Work on your balance and strength. This can help you avoid falls.  Wear a seatbelt when you are in a moving vehicle.  Wear a helmet when you: ? Ride a bicycle. ? Ski. ? Do any other sport or activity that has a risk of injury.  If you drink alcohol: ? Limit how much you use to:  0-1 drink a day for women.  0-2 drinks a day for men. ? Be aware of how much alcohol is in your drink. In the [place] one drink equals one 12 oz bottle of beer (355 mL), one 5 oz glass of wine (148 mL), or one 1 oz glass of hard liquor (44 mL).  Make your home safer by: ? Getting rid of clutter from the floors and stairs. This includes things that can make you trip. ? Using grab bars in bathrooms and handrails by stairs. ? Placing non-slip mats on floors and in bathtubs. ? Putting more light in dim areas. Get help right away if:  You have: ? A very bad headache that is not helped by medicine. ? Trouble walking or weakness in your arms and legs. ? Clear or bloody fluid coming from your nose or ears. ? Changes in how you see (vision). ? Shaking movements that you cannot control.  You lose your balance.  You vomit.  The black centers of your eyes (pupils) change in size.  Your speech is slurred.  Your dizziness gets worse.  You pass out.  You are sleepier than normal and have trouble staying awake.  Your symptoms get worse. These symptoms may be an emergency. Do not wait to see  if the symptoms will go away. Get medical help right away. Call your local emergency services (911 in the [place] Do not drive yourself to the hospital. Summary  There are many types of head injuries. They can be as minor as a bump. Some head injuries can be worse  Treatment for this condition depends on how severe the injury is and the type of injury you have.  Ask your doctor when it is safe for you to go back to your normal activities, such as work or school.  To prevent a head injury, wear a seat belt in a car, wear a helmet when you use a a bicycle, limit your alcohol use, and make your home safer. This information is not intended to replace advice given to you by your health care provider. Make sure you discuss any questions you   have with your health care provider. Document Revised: 10/07/2018 Document Reviewed: 07/09/2018 Elsevier Patient Education  [place].

## 2020-06-04 NOTE — Progress Notes (Signed)
Established patient visit   Patient: Mariah Levy   DOB: 03-Oct-1940   79 y.o. Female  MRN: [id number] Visit Date: 06/04/2020  Today's healthcare provider: Lavon Paganini, MD   Chief Complaint  Patient presents with  . Hypertension    Blood pressure has been running high.   Subjective    HPI HPI    Hypertension     Additional comments: Blood pressure has been running high.       Last edited by Kizzie Furnish, CMA on 06/04/2020 10:35 AM. (History)      Mariah Levy was in the ladder in the attic when she fell backwards off of the top step and hit her head on the foot end of a recliner chair. She noticed the fall hurt her head but she had no loss of consiousness, subsequent dizziness, vision changes or weakness. She did not notice any additional symptoms until 3-4 days when she felt off. Her "head felt light, and slightly nauseas" She took her blood pressure several times over the weekend to find it elevated to 035 systolic ranging form 009-381 with her normal range 120-130.  She has not taken anything to alleviate her symptoms and notices that the 'feeling off' comes and goes. Today she has no weakness, dizziness vision changes, headaches changing from baseline, N/V/D rashes or new bruising.   Social History   Tobacco Use  . Smoking status: Former Smoker    Packs/day: 1.00    Years: 2.00    Pack years: 2.00    Quit date: 06/30/1959    Years since quitting: 60.9  . Smokeless tobacco: Never Used  Vaping Use  . Vaping Use: Never used  Substance Use Topics  . Alcohol use: Not Currently  . Drug use: No       Medications: Outpatient Medications Prior to Visit  Medication Sig  . MAGNESIUM PO 2 (two) times daily.   . Multiple Vitamins-Minerals (ICAPS AREDS 2 PO) Take by mouth daily.   Marland Kitchen OVER THE COUNTER MEDICATION daily. [place]   . triamterene-hydrochlorothiazide (MAXZIDE-25) 37.5-25 MG tablet TAKE 1 TABLET BY MOUTH EVERY DAY  . TURMERIC PO Take by mouth daily. With ginger  or Curcumin   No facility-administered medications prior to visit.    Review of Systems  Constitutional: Negative.   HENT: Negative.   Eyes: Negative.   Respiratory: Negative.   Cardiovascular: Negative.   Gastrointestinal: Negative.   Endocrine: Negative.   Genitourinary: Negative.   Musculoskeletal: Negative.   Skin: Negative.   Allergic/Immunologic: Negative.   Neurological: Negative.   Hematological: Negative.   Psychiatric/Behavioral: Negative.       Objective    BP (!) 149/88 (BP Location: Left Arm, Patient Position: Sitting, Cuff Size: Normal)   Pulse 88   Temp 98.5 F (36.9 C) (Oral)   Wt 91 lb (41.3 kg)   BMI 17.77 kg/m    Physical Exam   General: NAD, slightly anxious appearing Pulm: Lungs CTA bilaterally Cards: RRR with occasional ectopic beats, no murmurs rubs or gallops Derm: No visible bruising or signs of trauma Neuro: CN II-XII tested and intact, intact finger nose testing, +rapid hand movements, - Romberg test, no changes in vision smell or taste Extremities: Strength 5/5 in all four extremities, no lower extremity swelling HEENT: Atraumatic head, PERRL, Ocular movements intact, Visual fields tested and intact Psych: Alert and oriented  Mood: "okay"  Affect: Anxious, mood congruent, brightens on approach  Conversation: clear and coherent   Thought Process: Logical  and linear       No results found for any visits on 06/04/20.  Assessment & Plan     Problem List Items Addressed This Visit      Cardiovascular and Mediastinum   Essential hypertension    Previously well controlled Acutely elevated in the context of a recent head trauma No changes to chronic med management at this time        Other   Head injury - Primary    [place] off the top step of a small ladder, injured head with no loss of consciousness 4 days later experienced feeling 'off', recording high home blood pressures systolic to 616W Neuro exam reassuring Alert and  oriented Concern for insidious subdural hemorrhage Stat CT Head wo contrast      Relevant Orders   CT Head Wo Contrast   Fall   Relevant Orders   CT Head Wo Contrast       Return if symptoms worsen or fail to improve.      CT Head results reviewed:  No acute intracranial abnormalities. Discussed with patient. If BP continues to be elevated later this week, she will call and we will adjust meds.   Patient seen along with [employer] student [facility]. I personally evaluated this patient along with the student, and verified all aspects of the history, physical exam, and medical decision making as documented by the student. I agree with the student's documentation and have made all necessary edits.  Erven Ramson, Dionne Bucy, MD, MPH [facility]

## 2020-06-04 NOTE — Assessment & Plan Note (Signed)
Previously well controlled Acutely elevated in the context of a recent head trauma No changes to chronic med management at this time

## 2020-06-05 ENCOUNTER — Ambulatory Visit: Payer: MEDICARE

## 2020-06-07 ENCOUNTER — Telehealth: Payer: Self-pay | Admitting: Family Medicine

## 2020-06-07 NOTE — Telephone Encounter (Signed)
Please review. Thanks!  

## 2020-06-07 NOTE — Telephone Encounter (Signed)
Pt called back and would like to speak to PCP regarding message below. Please advise

## 2020-06-07 NOTE — Telephone Encounter (Signed)
Pt saw dr b on Monday 06-04-2020 and was told to callback with BP readings 12-6 evening  bp  141/75 and lowest was bp 132/73 in the middle of night at 130 am Tuesday  morning . 12/7 bp at 8 am 146/75 after a cup of coffee bp 123/81 at 11 am bp 136/62 at 1105 am bp 134/65 at 3 pm 139/68 at 530 pm bp 153/74 at 1130 pm bp 133/75 and 12-8 bp 715 am 124/71 at 1015 am bp 154/77 and 1020 am 146/71 at 12 noon  bp 148/81 at 2 pm bp 149/77 at 6 pm bp 158/79 at 7pm bp 146/81 at 1030 pm bp 150/84 at 1032 bp 158/80 . Pt states she wakes up with headache and that is normal for her. Please advice

## 2020-06-08 NOTE — Telephone Encounter (Signed)
Patient advised and verbalized understanding 

## 2020-06-08 NOTE — Telephone Encounter (Signed)
Overall BPs look like they are getting better. Would continue current medications and continue to monitor once daily and we can reassess at appt in January.  If develops chest pain, changes in vision, severe headache needs to seek more urgent care.

## 2020-06-12 DIAGNOSIS — H353132 Nonexudative age-related macular degeneration, bilateral, intermediate dry stage: Secondary | ICD-10-CM | POA: Diagnosis not present

## 2020-06-30 ENCOUNTER — Other Ambulatory Visit: Payer: Self-pay | Admitting: Family Medicine

## 2020-07-02 ENCOUNTER — Ambulatory Visit: Payer: MEDICARE | Admitting: Family Medicine

## 2020-08-02 ENCOUNTER — Encounter: Payer: Self-pay | Admitting: Family Medicine

## 2020-08-02 ENCOUNTER — Other Ambulatory Visit: Payer: Self-pay

## 2020-08-02 ENCOUNTER — Ambulatory Visit (INDEPENDENT_AMBULATORY_CARE_PROVIDER_SITE_OTHER): Payer: MEDICARE | Admitting: Family Medicine

## 2020-08-02 VITALS — BP 122/86 | HR 74 | Temp 98.4°F | Resp 16 | Wt 97.8 lb

## 2020-08-02 DIAGNOSIS — Z2821 Immunization not carried out because of patient refusal: Secondary | ICD-10-CM | POA: Insufficient documentation

## 2020-08-02 DIAGNOSIS — Z28310 Unvaccinated for covid-19: Secondary | ICD-10-CM

## 2020-08-02 DIAGNOSIS — G8929 Other chronic pain: Secondary | ICD-10-CM | POA: Insufficient documentation

## 2020-08-02 DIAGNOSIS — I10 Essential (primary) hypertension: Secondary | ICD-10-CM | POA: Diagnosis not present

## 2020-08-02 DIAGNOSIS — E7800 Pure hypercholesterolemia, unspecified: Secondary | ICD-10-CM | POA: Diagnosis not present

## 2020-08-02 DIAGNOSIS — K5900 Constipation, unspecified: Secondary | ICD-10-CM

## 2020-08-02 DIAGNOSIS — M25862 Other specified joint disorders, left knee: Secondary | ICD-10-CM | POA: Diagnosis not present

## 2020-08-02 DIAGNOSIS — M25562 Pain in left knee: Secondary | ICD-10-CM | POA: Insufficient documentation

## 2020-08-02 NOTE — Assessment & Plan Note (Deleted)
Mild pain of L knee now progressing to right .

## 2020-08-02 NOTE — Assessment & Plan Note (Signed)
Patient reports 2 weeks of new onset constipation with change in stool caliber and incomplete voiding.  No weight loss or systemic symptoms or blood in stool History of constipation with decreased stool caliber in 2016 and 2018 Benign abdmonial exam Multiple bowel surgeries including appendectomy, hysterectomy and hemorrhoidectomy Has tried miralax with limited benefit Has not had a colonoscopy in 25 years, never done FOBT Consider reffereal to GI for coloscopy

## 2020-08-02 NOTE — Assessment & Plan Note (Signed)
Previously elevated, likely in context of minor head trauma 122/86 today Patient states she stopped taking gniger and tumeric due to interaction with BP meds Continue current regimen Recheck CMP F/u 6 months

## 2020-08-02 NOTE — Patient Instructions (Signed)
Preventive Care 81 Years and Older, Female Preventive care refers to lifestyle choices and visits with your health care provider that can promote health and wellness. This includes:  A yearly physical exam. This is also called an annual wellness visit.  Regular dental and eye exams.  Immunizations.  Screening for certain conditions.  Healthy lifestyle choices, such as: ? Eating a healthy diet. ? Getting regular exercise. ? Not using drugs or products that contain nicotine and tobacco. ? Limiting alcohol use. What can I expect for my preventive care visit? Physical exam Your health care provider will check your:  Height and weight. These may be used to calculate your BMI (body mass index). BMI is a measurement that tells if you are at a healthy weight.  Heart rate and blood pressure.  Body temperature.  Skin for abnormal spots. Counseling Your health care provider may ask you questions about your:  Past medical problems.  Family's medical history.  Alcohol, tobacco, and drug use.  Emotional well-being.  Home life and relationship well-being.  Sexual activity.  Diet, exercise, and sleep habits.  History of falls.  Memory and ability to understand (cognition).  Work and work Statistician.  Pregnancy and menstrual history.  Access to firearms. What immunizations do I need? Vaccines are usually given at various ages, according to a schedule. Your health care provider will recommend vaccines for you based on your age, medical history, and lifestyle or other factors, such as travel or where you work.   What tests do I need? Blood tests  Lipid and cholesterol levels. These may be checked every 5 years, or more often depending on your overall health.  Hepatitis C test.  Hepatitis B test. Screening  Lung cancer screening. You may have this screening every year starting at age 81 if you have a 30-pack-year history of smoking and currently smoke or have quit within  the past 15 years.  Colorectal cancer screening. ? All adults should have this screening starting at age 44 and continuing until age 81. ? Your health care provider may recommend screening at age 81 if you are at increased risk. ? You will have tests every 1-10 years, depending on your results and the type of screening test.  Diabetes screening. ? This is done by checking your blood sugar (glucose) after you have not eaten for a while (fasting). ? You may have this done every 1-3 years.  Mammogram. ? This may be done every 1-2 years. ? Talk with your health care provider about how often you should have regular mammograms.  Abdominal aortic aneurysm (AAA) screening. You may need this if you are a current or former smoker.  BRCA-related cancer screening. This may be done if you have a family history of breast, ovarian, tubal, or peritoneal cancers. Other tests  STD (sexually transmitted disease) testing, if you are at risk.  Bone density scan. This is done to screen for osteoporosis. You may have this done starting at age 81. Talk with your health care provider about your test results, treatment options, and if necessary, the need for more tests. Follow these instructions at home: Eating and drinking  Eat a diet that includes fresh fruits and vegetables, whole grains, lean protein, and low-fat dairy products. Limit your intake of foods with high amounts of sugar, saturated fats, and salt.  Take vitamin and mineral supplements as recommended by your health care provider.  Do not drink alcohol if your health care provider tells you not to drink.  If you drink alcohol: ? Limit how much you have to 0-1 drink a day. ? Be aware of how much alcohol is in your drink. In the [place] one drink equals one 12 oz bottle of beer (355 mL), one 5 oz glass of wine (148 mL), or one 1 oz glass of hard liquor (44 mL).   Lifestyle  Take daily care of your teeth and gums. Brush your teeth every morning  and night with fluoride toothpaste. Floss one time each day.  Stay active. Exercise for at least 30 minutes 5 or more days each week.  Do not use any products that contain nicotine or tobacco, such as cigarettes, e-cigarettes, and chewing tobacco. If you need help quitting, ask your health care provider.  Do not use drugs.  If you are sexually active, practice safe sex. Use a condom or other form of protection in order to prevent STIs (sexually transmitted infections).  Talk with your health care provider about taking a low-dose aspirin or statin.  Find healthy ways to cope with stress, such as: ? Meditation, yoga, or listening to music. ? Journaling. ? Talking to a trusted person. ? Spending time with friends and family. Safety  Always wear your seat belt while driving or riding in a vehicle.  Do not drive: ? If you have been drinking alcohol. Do not ride with someone who has been drinking. ? When you are tired or distracted. ? While texting.  Wear a helmet and other protective equipment during sports activities.  If you have firearms in your house, make sure you follow all gun safety procedures. What's next?  Visit your health care provider once a year for an annual wellness visit.  Ask your health care provider how often you should have your eyes and teeth checked.  Stay up to date on all vaccines. This information is not intended to replace advice given to you by your health care provider. Make sure you discuss any questions you have with your health care provider. Document Revised: 06/06/2020 Document Reviewed: 06/10/2018 Elsevier Patient Education  2021 Elsevier Inc.  

## 2020-08-02 NOTE — Progress Notes (Signed)
Established patient visit   Patient: Mariah Levy   DOB: 1940/08/17   80 y.o. Female  MRN: [id number] Visit Date: 08/02/2020  Today's healthcare provider: Lavon Paganini, MD   Chief Complaint  Patient presents with   Follow-up   Knee Pain   Constipation   Subjective    HPI   Mariah Levy presents for a one month follow up with multiple concerns.   She has experienced new constipation during the last few weeks, she describes decreased size and stool caliber, and also notes that she is unable to 'get the empty feeling'. She reports no blood, pain or straining with bowel movements and has gained 6 lbs in the last month. She has tried Miralax and stool softeners with some limited relief. She has not had a colonoscopy since age 50. Has never done FOBT. She states that she has never had constipation to this degree, and it usually resolves well on its own. She has a has a history of adhesions 2/2 multiple bowel surgeries. ROS otherwise normal.    With regard to her hypertension she believes that her ginger was interfering with the medicine and has reduced it. She found that her home readings have corrected and she feels better. She has had no trouble taking her medications.   [name] also concerned about knee pain. She notes that especially when climbing stairs she feels that her knee cap will move sideways and 'lock' causing pain and buckling of her leg. It began in her left knee and is now bother her right knee. She reports no ache, swelling or erythema, and the pain is only reproducible when walking up stairs or standing from a squat. She has not tried anything for the pain.    Social History   Tobacco Use   Smoking status: Former Smoker    Packs/day: 1.00    Years: 2.00    Pack years: 2.00    Quit date: 06/30/1959    Years since quitting: 61.1   Smokeless tobacco: Never Used  Vaping Use   Vaping Use: Never used  Substance Use Topics   Alcohol use: Not Currently   Drug  use: No       Medications: Outpatient Medications Prior to Visit  Medication Sig   aspirin EC 81 MG tablet Take 81 mg by mouth daily. Swallow whole.   MAGNESIUM PO Take 500 mg by mouth 2 (two) times daily.   Multiple Vitamins-Minerals (ICAPS AREDS 2 PO) Take by mouth daily.    OVER THE COUNTER MEDICATION daily. Moringa   OVER THE COUNTER MEDICATION Take 1 capsule by mouth daily. BPS-5   triamterene-hydrochlorothiazide (MAXZIDE-25) 37.5-25 MG tablet TAKE 1 TABLET BY MOUTH EVERY DAY   TURMERIC PO Take by mouth daily. With Curcumin   No facility-administered medications prior to visit.    Review of Systems  Constitutional: Negative.   HENT: Negative.   Eyes: Negative.   Respiratory: Negative.   Cardiovascular: Negative.   Gastrointestinal: Positive for constipation.  Endocrine: Negative.   Genitourinary: Negative.   Musculoskeletal: Positive for arthralgias.  Skin: Negative.   Allergic/Immunologic: Negative.   Neurological: Negative.   Hematological: Negative.   Psychiatric/Behavioral: Negative.        Objective    BP 122/86 (BP Location: Left Arm, Patient Position: Sitting, Cuff Size: Normal)    Pulse 74    Temp 98.4 F (36.9 C) (Oral)    Resp 16    Wt 97 lb 12.8 oz (44.4 kg)  SpO2 98%    BMI 19.10 kg/m     Physical Exam   General: Well appearing woman sitting upright in NAD Pulm: Lungs CTA bilaterally Cards: RRR no murmurs rubs or gallops Abdomen: Soft non-tender, non-distended MSK: 5/5 strength of legs bilaterally, knee tenderness reproducible with isolated quadriceps contraction, no ligament laxaity. Free of swelling, warmth or erythema Psych: Alert and oriented, mood and affect appropriate to situation.   No results found for any visits on 08/02/20.  Assessment & Plan     Problem List Items Addressed This Visit      Cardiovascular and Mediastinum   Essential hypertension - Primary    Previously elevated, likely in context of minor head  trauma 122/86 today Patient states she stopped taking gniger and tumeric due to interaction with BP meds Continue current regimen Recheck CMP F/u 6 months       Relevant Medications   aspirin EC 81 MG tablet   Other Relevant Orders   Comprehensive metabolic panel     Musculoskeletal and Integument   Patellofemoral dysfunction, left    New problem pain of L knee now progressing to right . Only present when climbing stairs or standing from a squat Positive tenderness of patellofemoral aponeurosis during quadriceps contraction Consistent with patellofemoral pain syndrome  Referral to PT Tylenol PRN      Relevant Orders   Ambulatory referral to Physical Therapy     Other   Hypercholesteremia    Not currently on a statin Will recheck FLP today      Relevant Medications   aspirin EC 81 MG tablet   Other Relevant Orders   Comprehensive metabolic panel   Lipid panel   Constipation    Patient reports 2 weeks of new onset constipation with change in stool caliber and incomplete voiding.  No weight loss or systemic symptoms or blood in stool History of constipation with decreased stool caliber in 2016 and 2018 Benign abdmonial exam Multiple bowel surgeries including appendectomy, hysterectomy and hemorrhoidectomy Has tried miralax with limited benefit Has not had a colonoscopy in 25 years, never done FOBT Consider reffereal to GI for coloscopy        Relevant Orders   Ambulatory referral to Gastroenterology   COVID-19 vaccine first dose declined    Not interested in COVID vaccine, is not open to discussion and firm in her beliefs        Also increase miralax intake to goal of 1 soft BM daily while waiting on GI appt   Return in about 6 months (around 01/30/2021) for chronic disease f/u.      Patient seen along with MS3 student [facility]. I personally evaluated this patient along with the student, and verified all aspects of the history, physical exam, and  medical decision making as documented by the student. I agree with the student's documentation and have made all necessary edits.  Faylene Allerton, Dionne Bucy, MD, MPH [facility]

## 2020-08-02 NOTE — Assessment & Plan Note (Addendum)
Not interested in COVID vaccine, is not open to discussion and firm in her beliefs

## 2020-08-02 NOTE — Assessment & Plan Note (Addendum)
New problem pain of L knee now progressing to right . Only present when climbing stairs or standing from a squat Positive tenderness of patellofemoral aponeurosis during quadriceps contraction Consistent with patellofemoral pain syndrome  Referral to PT Tylenol PRN

## 2020-08-02 NOTE — Assessment & Plan Note (Signed)
Not currently on a statin Will recheck FLP today

## 2020-08-03 ENCOUNTER — Other Ambulatory Visit: Payer: Self-pay | Admitting: *Deleted

## 2020-08-03 DIAGNOSIS — E7800 Pure hypercholesterolemia, unspecified: Secondary | ICD-10-CM

## 2020-08-03 LAB — COMPREHENSIVE METABOLIC PANEL
ALT: 11 IU/L (ref 0–32)
AST: 23 IU/L (ref 0–40)
Albumin/Globulin Ratio: 2 (ref 1.2–2.2)
Albumin: 4.6 g/dL (ref 3.7–4.7)
Alkaline Phosphatase: 55 IU/L (ref 44–121)
BUN/Creatinine Ratio: 16 (ref 12–28)
BUN: 12 mg/dL (ref 8–27)
Bilirubin Total: 0.3 mg/dL (ref 0.0–1.2)
CO2: 25 mmol/L (ref 20–29)
Calcium: 10 mg/dL (ref 8.7–10.3)
Chloride: 101 mmol/L (ref 96–106)
Creatinine, Ser: 0.74 mg/dL (ref 0.57–1.00)
GFR calc Af Amer: 88 mL/min/{1.73_m2} (ref >59)
GFR calc non Af Amer: 77 mL/min/{1.73_m2} (ref >59)
Globulin, Total: 2.3 g/dL (ref 1.5–4.5)
Glucose: 85 mg/dL (ref 65–99)
Potassium: 4.7 mmol/L (ref 3.5–5.2)
Sodium: 140 mmol/L (ref 134–144)
Total Protein: 6.9 g/dL (ref 6.0–8.5)

## 2020-08-03 LAB — LIPID PANEL
Chol/HDL Ratio: 3.5 ratio (ref 0.0–4.4)
Cholesterol, Total: 342 mg/dL — ABNORMAL HIGH (ref 100–199)
HDL: 97 mg/dL (ref >39)
LDL Chol Calc (NIH): 239 mg/dL — ABNORMAL HIGH (ref 0–99)
Triglycerides: 56 mg/dL (ref 0–149)
VLDL Cholesterol Cal: 6 mg/dL (ref 5–40)

## 2020-08-03 MED ORDER — ROSUVASTATIN CALCIUM 5 MG PO TABS: 5.0000 mg | ORAL_TABLET | Freq: Every day | ORAL | 6 refills | Status: DC

## 2020-08-09 ENCOUNTER — Ambulatory Visit: Payer: MEDICARE

## 2020-08-14 ENCOUNTER — Ambulatory Visit: Payer: MEDICARE

## 2020-08-20 ENCOUNTER — Other Ambulatory Visit: Payer: Self-pay

## 2020-08-20 ENCOUNTER — Encounter: Payer: Self-pay | Admitting: Gastroenterology

## 2020-08-20 ENCOUNTER — Ambulatory Visit (INDEPENDENT_AMBULATORY_CARE_PROVIDER_SITE_OTHER): Payer: MEDICARE | Admitting: Gastroenterology

## 2020-08-20 VITALS — BP 179/80 | HR 80 | Temp 97.9°F | Ht 60.0 in | Wt 95.5 lb

## 2020-08-20 DIAGNOSIS — K5900 Constipation, unspecified: Secondary | ICD-10-CM

## 2020-08-20 NOTE — Patient Instructions (Signed)
High-Fiber Eating Plan Fiber, also called dietary fiber, is a type of carbohydrate. It is found foods such as fruits, vegetables, whole grains, and beans. A high-fiber diet can have many health benefits. Your health care provider may recommend a high-fiber diet to help:  Prevent constipation. Fiber can make your bowel movements more regular.  Lower your cholesterol.  Relieve the following conditions: ? Inflammation of veins in the anus (hemorrhoids). ? Inflammation of specific areas of the digestive tract (uncomplicated diverticulosis). ? A problem of the large intestine, also called the colon, that sometimes causes pain and diarrhea (irritable bowel syndrome, or IBS).  Prevent overeating as part of a weight-loss plan.  Prevent heart disease, type 2 diabetes, and certain cancers. What are tips for following this plan? Reading food labels  Check the nutrition facts label on food products for the amount of dietary fiber. Choose foods that have 5 grams of fiber or more per serving.  The goals for recommended daily fiber intake include: ? Men (age 50 or younger): 34-38 g. ? Men (over age 50): 28-34 g. ? Women (age 50 or younger): 25-28 g. ? Women (over age 50): 22-25 g. Your daily fiber goal is _____________ g.   Shopping  Choose whole fruits and vegetables instead of processed forms, such as apple juice or applesauce.  Choose a wide variety of high-fiber foods such as avocados, lentils, oats, and kidney beans.  Read the nutrition facts label of the foods you choose. Be aware of foods with added fiber. These foods often have high sugar and sodium amounts per serving. Cooking  Use whole-grain flour for baking and cooking.  Cook with brown rice instead of white rice. Meal planning  Start the day with a breakfast that is high in fiber, such as a cereal that contains 5 g of fiber or more per serving.  Eat breads and cereals that are made with whole-grain flour instead of refined  flour or white flour.  Eat brown rice, bulgur wheat, or millet instead of white rice.  Use beans in place of meat in soups, salads, and pasta dishes.  Be sure that half of the grains you eat each day are whole grains. General information  You can get the recommended daily intake of dietary fiber by: ? Eating a variety of fruits, vegetables, grains, nuts, and beans. ? Taking a fiber supplement if you are not able to take in enough fiber in your diet. It is better to get fiber through food than from a supplement.  Gradually increase how much fiber you consume. If you increase your intake of dietary fiber too quickly, you may have bloating, cramping, or gas.  Drink plenty of water to help you digest fiber.  Choose high-fiber snacks, such as berries, raw vegetables, nuts, and popcorn. What foods should I eat? Fruits Berries. Pears. Apples. Oranges. Avocado. Prunes and raisins. Dried figs. Vegetables Sweet potatoes. Spinach. Kale. Artichokes. Cabbage. Broccoli. Cauliflower. Green peas. Carrots. Squash. Grains Whole-grain breads. Multigrain cereal. Oats and oatmeal. Brown rice. Barley. Bulgur wheat. Millet. Quinoa. Bran muffins. Popcorn. Rye wafer crackers. Meats and other proteins Navy beans, kidney beans, and pinto beans. Soybeans. Split peas. Lentils. Nuts and seeds. Dairy Fiber-fortified yogurt. Beverages Fiber-fortified soy milk. Fiber-fortified orange juice. Other foods Fiber bars. The items listed above may not be a complete list of recommended foods and beverages. Contact a dietitian for more information. What foods should I avoid? Fruits Fruit juice. Cooked, strained fruit. Vegetables Fried potatoes. Canned vegetables. Well-cooked vegetables. Grains   White bread. Pasta made with refined flour. White rice. Meats and other proteins Fatty cuts of meat. Fried chicken or fried fish. Dairy Milk. Yogurt. Cream cheese. Sour cream. Fats and oils Butters. Beverages Soft  drinks. Other foods Cakes and pastries. The items listed above may not be a complete list of foods and beverages to avoid. Talk with your dietitian about what choices are best for you. Summary  Fiber is a type of carbohydrate. It is found in foods such as fruits, vegetables, whole grains, and beans.  A high-fiber diet has many benefits. It can help to prevent constipation, lower blood cholesterol, aid weight loss, and reduce your risk of heart disease, diabetes, and certain cancers.  Increase your intake of fiber gradually. Increasing fiber too quickly may cause cramping, bloating, and gas. Drink plenty of water while you increase the amount of fiber you consume.  The best sources of fiber include whole fruits and vegetables, whole grains, nuts, seeds, and beans. This information is not intended to replace advice given to you by your health care provider. Make sure you discuss any questions you have with your health care provider. Document Revised: 10/20/2019 Document Reviewed: 10/20/2019 Elsevier Patient Education  2021 Elsevier Inc.  

## 2020-08-21 NOTE — Progress Notes (Signed)
Mariah Levy [address]  [place], [place] [address]  Main: [phone]  Fax: [phone]   Gastroenterology Consultation  Referring Provider:     Virginia Crews, MD Primary Care Physician:  Virginia Crews, MD Reason for Consultation:     Constipation        HPI:    Chief Complaint  Patient presents with  . New Patient (Initial Visit)  . Constipation    States she stopped drink as much coffee and got constipation. She states she began to take Miralax and her bowel movements began to get better but still never felt like they were totally empty. She states one day she drunk a glass of coffee and had a normal bowel movement. She states now is not taking Miralax and is having a bowel movement daily     Mariah Levy is a 80 y.o. y/o female referred for consultation & management  by Dr. Brita Romp, Dionne Bucy, MD.  Patient reports normally she has 1 bowel movement today, but was previously drinking several cups of coffee every day.  However, due to recently elevated blood pressures, she completely stopped her all caffeine intake and noted that she was having very small bowel movements compared to what she was used to.  No weight loss, abdominal pain, blood in stool, nausea or vomiting.  No family history of colon cancer.  Reports having only one colonoscopy in her lifetime, when she was in her 70s and it was normal.  Since realizing that her change in bowel habits may be related to her cutting off all caffeine compared to what she was drinking before, she has started instituting coffee into her diet again, about 1 cup a day and this has resolved all her symptoms and she is back to baseline of having 1 soft formed bowel movement today.  She was taking MiraLAX as needed but has not had to take it in a while.  Past Medical History:  Diagnosis Date  . Hypertension     Past Surgical History:  Procedure Laterality Date  . ABDOMINAL HYSTERECTOMY  2006  .  APPENDECTOMY    . BREAST SURGERY Right 1985   lumpectomy x 2  . BREAST SURGERY Right 1996   milk duct removal  . DILATION AND CURETTAGE OF UTERUS    . EYE SURGERY Bilateral    cataract/ glaucoma  . HEMORROIDECTOMY      Prior to Admission medications   Medication Sig Start Date End Date Taking? Authorizing Provider  aspirin EC 81 MG tablet Take 81 mg by mouth daily. Swallow whole.   Yes [provider]  MAGNESIUM PO Take 500 mg by mouth 2 (two) times daily. 09/15/16  Yes [provider]  Multiple Vitamins-Minerals (ICAPS AREDS 2 PO) Take by mouth daily.    Yes [provider]  OVER THE COUNTER MEDICATION daily. Moringa   Yes [provider]  OVER THE COUNTER MEDICATION Take 1 capsule by mouth daily. BPS-5   Yes [provider]  triamterene-hydrochlorothiazide (MAXZIDE-25) 37.5-25 MG tablet TAKE 1 TABLET BY MOUTH EVERY DAY 07/02/20  Yes Bacigalupo, Dionne Bucy, MD  TURMERIC PO Take by mouth daily. With Curcumin   Yes [provider]  rosuvastatin (CRESTOR) 5 MG tablet Take 1 tablet (5 mg total) by mouth daily. Patient not taking: Reported on 08/20/2020 08/03/20   Virginia Crews, MD    Family History  Problem Relation Age of Onset  . Hypertension Mother   .  Arthritis Mother   . Stroke Mother   . Cancer Father        unknown origin  . Aneurysm Father   . Hypertension Brother   . Diabetes Brother   . Coronary artery disease Brother   . Heart attack Brother   . COPD Brother   . Cancer Brother        lung  . Early death Brother        Died as infant  . Pneumonia Brother   . Diabetes Son   . Diabetes Maternal Grandmother   . Colon cancer Maternal Grandfather   . Breast cancer Neg Hx      Social History   Tobacco Use  . Smoking status: Former Smoker    Packs/day: 1.00    Years: 2.00    Pack years: 2.00    Quit date: 06/30/1959    Years since quitting: 61.1  . Smokeless tobacco: Never Used  Vaping Use  . Vaping Use:  Never used  Substance Use Topics  . Alcohol use: Not Currently  . Drug use: No    Allergies as of 08/20/2020 - Review Complete 08/20/2020  Allergen Reaction Noted  . Atacand  [candesartan] Shortness Of Breath 01/15/2015  . Iodine Shortness Of Breath 10/12/2012  . Other Anaphylaxis 12/22/2014  . Demerol [meperidine] Hives 10/12/2012  . Propoxyphene Hives 09/05/2015  . Ramipril Swelling 01/15/2015  . Penicillins Rash 10/12/2012    Review of Systems:    All systems reviewed and negative except where noted in HPI.   Physical Exam:  BP (!) 179/80 (BP Location: Left Arm, Patient Position: Sitting, Cuff Size: Normal)   Pulse 80   Temp 97.9 F (36.6 C) (Oral)   Ht 5' (1.524 m)   Wt 95 lb 8 oz (43.3 kg)   BMI 18.65 kg/m  No LMP recorded. Patient has had a hysterectomy. Psych:  Alert and cooperative. Normal mood and affect. General:   Alert,  Well-developed, well-nourished, pleasant and cooperative in NAD Head:  Normocephalic and atraumatic. Eyes:  Sclera clear, no icterus.   Conjunctiva pink. Ears:  Normal auditory acuity. Nose:  No deformity, discharge, or lesions. Mouth:  No deformity or lesions,oropharynx pink & moist. Neck:  Supple; no masses or thyromegaly. Abdomen:  Normal bowel sounds.  No bruits.  Soft, non-tender and non-distended without masses, hepatosplenomegaly or hernias noted.  No guarding or rebound tenderness.    Msk:  Symmetrical without gross deformities. Good, equal movement & strength bilaterally. Pulses:  Normal pulses noted. Extremities:  No clubbing or edema.  No cyanosis. Neurologic:  Alert and oriented x3;  grossly normal neurologically. Skin:  Intact without significant lesions or rashes. No jaundice. Lymph Nodes:  No significant cervical adenopathy. Psych:  Alert and cooperative. Normal mood and affect.   Labs: CBC    Component Value Date/Time   WBC 5.4 01/21/2018 0811   WBC 6.0 06/03/2015 1342   RBC 4.19 01/21/2018 0811   RBC 4.27 06/03/2015  1342   HGB 12.9 01/21/2018 0811   HCT 38.8 01/21/2018 0811   PLT 372 01/21/2018 0811   MCV 93 01/21/2018 0811   MCH 30.8 01/21/2018 0811   MCH 31.8 06/03/2015 1342   MCHC 33.2 01/21/2018 0811   MCHC 33.9 06/03/2015 1342   RDW 12.5 01/21/2018 0811   LYMPHSABS 1.8 06/03/2015 1342   LYMPHSABS 1.7 05/23/2015 0900   MONOABS 0.7 06/03/2015 1342   EOSABS 0.1 06/03/2015 1342   EOSABS 0.1 05/23/2015 0900   BASOSABS 0.0 06/03/2015  1342   BASOSABS 0.0 05/23/2015 0900   CMP     Component Value Date/Time   NA 140 08/02/2020 1131   K 4.7 08/02/2020 1131   CL 101 08/02/2020 1131   CO2 25 08/02/2020 1131   GLUCOSE 85 08/02/2020 1131   GLUCOSE 159 (H) 06/08/2015 1025   BUN 12 08/02/2020 1131   CREATININE 0.74 08/02/2020 1131   CALCIUM 10.0 08/02/2020 1131   PROT 6.9 08/02/2020 1131   ALBUMIN 4.6 08/02/2020 1131   AST 23 08/02/2020 1131   ALT 11 08/02/2020 1131   ALKPHOS 55 08/02/2020 1131   BILITOT 0.3 08/02/2020 1131   GFRNONAA 77 08/02/2020 1131   GFRAA 88 08/02/2020 1131    Imaging Studies: No results found.  Assessment and Plan:   Mariah Levy is a 80 y.o. y/o female has been referred for constipation  Patient abruptly stopped all caffeine intake, compared to at least 4 to 6 cups of coffee that she was previously drinking  Since she has restarted about 1 cup of coffee a day, her bowel movements are back to baseline she is happy with this  She does not have any alarm symptoms otherwise, and her labs are very reassuring  However, we did discuss that since she only had 1 colonoscopy in her lifetime and not any since she was in her 65s, she may have colon polyps or other lesions in her colon that have not been evaluated in years.  We did also discuss that usually screening colonoscopies are not recommended after 75, but this can also be determined on a case-by-case basis depending on the clinical picture.  Therefore, colonoscopy was discussed with her in detail including risks  and benefits and patient refuses colonoscopy at this time  We discussed high-fiber diet in detail as well Since her bowel movements are back to baseline, no indication for pharmacologic therapy at this time  Okay to take MiraLAX as needed as patient is only taking it sparingly about once or twice a month.  However, if symptoms return or worsen she was advised to let us know immediately and she verbalized understanding  She does not want follow-up appointment at this time and would like to call as needed      Dr Mariah Levy  Speech recognition software was used to dictate the above note.

## 2020-09-27 ENCOUNTER — Other Ambulatory Visit: Payer: Self-pay | Admitting: Family Medicine

## 2020-09-27 NOTE — Telephone Encounter (Signed)
Requested Prescriptions  Pending Prescriptions Disp Refills  . triamterene-hydrochlorothiazide (MAXZIDE-25) 37.5-25 MG tablet [Pharmacy Med Name: TRIAMTERENE-HCTZ 37.5-25 MG TB] 90 tablet 1    Sig: TAKE 1 TABLET BY MOUTH EVERY DAY     Cardiovascular: Diuretic Combos Failed - 09/27/2020  2:03 AM      Failed - Last BP in normal range    BP Readings from Last 1 Encounters:  08/20/20 (!) 179/80         Passed - K in normal range and within 360 days    Potassium  Date Value Ref Range Status  08/02/2020 4.7 3.5 - 5.2 mmol/L Final         Passed - Na in normal range and within 360 days    Sodium  Date Value Ref Range Status  08/02/2020 140 134 - 144 mmol/L Final         Passed - Cr in normal range and within 360 days    Creatinine, Ser  Date Value Ref Range Status  08/02/2020 0.74 0.57 - 1.00 mg/dL Final         Passed - Ca in normal range and within 360 days    Calcium  Date Value Ref Range Status  08/02/2020 10.0 8.7 - 10.3 mg/dL Final         Passed - Valid encounter within last 6 months    Recent Outpatient Visits          1 month ago Essential hypertension   [place], Dionne Bucy, MD   3 months ago Injury of head, initial encounter   [place], Dionne Bucy, MD   1 year ago Essential hypertension   [place], Dionne Bucy, MD   2 years ago Medicare annual wellness visit, subsequent   [facility] [place], Dionne Bucy, MD   2 years ago Essential hypertension   [place], Dionne Bucy, MD      Future Appointments            In 4 months Bacigalupo, Dionne Bucy, MD [facility]

## 2020-10-25 DIAGNOSIS — H903 Sensorineural hearing loss, bilateral: Secondary | ICD-10-CM | POA: Diagnosis not present

## 2020-10-25 DIAGNOSIS — H6123 Impacted cerumen, bilateral: Secondary | ICD-10-CM | POA: Diagnosis not present

## 2020-10-30 DIAGNOSIS — H0100B Unspecified blepharitis left eye, upper and lower eyelids: Secondary | ICD-10-CM | POA: Diagnosis not present

## 2020-10-30 DIAGNOSIS — H0100A Unspecified blepharitis right eye, upper and lower eyelids: Secondary | ICD-10-CM | POA: Diagnosis not present

## 2020-12-20 DIAGNOSIS — H353132 Nonexudative age-related macular degeneration, bilateral, intermediate dry stage: Secondary | ICD-10-CM | POA: Diagnosis not present

## 2020-12-21 ENCOUNTER — Telehealth: Payer: Self-pay | Admitting: Family Medicine

## 2020-12-21 NOTE — Telephone Encounter (Signed)
Please review. Patient has an upcoming appt with you on 01/31/21. Is this ok to wait to discuss until then?

## 2020-12-21 NOTE — Telephone Encounter (Signed)
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 Brita Romp about this concern/ please advise   Pt also mentioned that she received 2 letters that Dr. B  is no longer with the practice and stated she called and someone told her this over th phone as well/ I advised her that Dr. Jacinto Reap was still in office and was out due to injury for a period of time / please clarify

## 2020-12-21 NOTE — Telephone Encounter (Signed)
Yes.  We can discuss at her next visit or sooner in a virtual visit if she would feel more comfortable and there is something available

## 2020-12-21 NOTE — Telephone Encounter (Signed)
Patient states that she can wait till her appt on 01/31/21 to discuss but she said her concern is hardening of her arteries  that she was told was due to hemorrhaging, patient wants to know do you think this is something she should be concerned about and also she questions what other arteries are hardened? Please advise. KW

## 2020-12-24 NOTE — Telephone Encounter (Signed)
[facility] needs ROI signed. Patient advised.

## 2020-12-24 NOTE — Telephone Encounter (Signed)
This is a long-term chronic problem. Needs to be talked through/discussed at a visit. Hard to explain over the phone through other people. Let's get Optho notes in the meantime.

## 2020-12-24 NOTE — Telephone Encounter (Signed)
Patient advised. Will call [facility] for office notes.

## 2020-12-24 NOTE — Telephone Encounter (Deleted)
Mariah Levy is a 80 y.o. female who presents to the emergency department today with primary concern of chronic back and neck pain.  He states this has been going on for years.  States he has had surgery in his spine doctors are at [facility].  Please advise.  Thank you,

## 2020-12-24 NOTE — Telephone Encounter (Signed)
I think this may be in the wrong chart Suli. May need to flag it with IT to get other patient's name and medical info out of her chart

## 2021-01-31 ENCOUNTER — Other Ambulatory Visit: Payer: Self-pay

## 2021-01-31 ENCOUNTER — Encounter: Payer: Self-pay | Admitting: Family Medicine

## 2021-01-31 ENCOUNTER — Ambulatory Visit (INDEPENDENT_AMBULATORY_CARE_PROVIDER_SITE_OTHER): Payer: MEDICARE | Admitting: Family Medicine

## 2021-01-31 VITALS — BP 152/82 | HR 74 | Wt 96.0 lb

## 2021-01-31 DIAGNOSIS — E7800 Pure hypercholesterolemia, unspecified: Secondary | ICD-10-CM | POA: Diagnosis not present

## 2021-01-31 DIAGNOSIS — I10 Essential (primary) hypertension: Secondary | ICD-10-CM | POA: Diagnosis not present

## 2021-01-31 DIAGNOSIS — E441 Mild protein-calorie malnutrition: Secondary | ICD-10-CM | POA: Diagnosis not present

## 2021-01-31 NOTE — Progress Notes (Signed)
Established patient visit   Patient: Mariah Levy   DOB: [birth year]   80 y.o. Female  MRN: [id number] Visit Date: 01/31/2021  Today's healthcare provider: Lavon Paganini, MD   Chief Complaint  Patient presents with   Follow-up    Subjective    HPI  Hypertension, follow-up  BP Readings from Last 3 Encounters:  01/31/21 (!) 152/82  08/20/20 (!) 179/80  08/02/20 122/86   Wt Readings from Last 3 Encounters:  01/31/21 96 lb (43.5 kg)  08/20/20 95 lb 8 oz (43.3 kg)  08/02/20 97 lb 12.8 oz (44.4 kg)     She was last seen for hypertension 6 months ago.  BP at that visit was 126/86. Management since that visit includes no changes.  She reports excellent compliance with treatment. [name] not having side effects.  [name] following a Regular diet. [name] exercising. She does not smoke.  Use of agents associated with hypertension: none.   Outside blood pressures are not being checked at home. Symptoms: No chest pain No chest pressure  No palpitations No syncope  No dyspnea No orthopnea  No paroxysmal nocturnal dyspnea No lower extremity edema   Pertinent labs: Lab Results  Component Value Date   CHOL 342 (H) 08/02/2020   HDL 97 08/02/2020   LDLCALC 239 (H) 08/02/2020   TRIG 56 08/02/2020   CHOLHDL 3.5 08/02/2020   Lab Results  Component Value Date   NA 140 08/02/2020   K 4.7 08/02/2020   CREATININE 0.74 08/02/2020   GFRNONAA 77 08/02/2020   GFRAA 88 08/02/2020   GLUCOSE 85 08/02/2020     The ASCVD Risk score ([place]off DC Jr., et al., 2013) failed to calculate for the following reasons:   The 2013 ASCVD risk score is only valid for ages 25 to 57   --------------------------------------------------------------------------------------------------- Lipid/Cholesterol, Follow-up  Last lipid panel Other pertinent labs  Lab Results  Component Value Date   CHOL 342 (H) 08/02/2020   HDL 97 08/02/2020   LDLCALC 239 (H) 08/02/2020   TRIG 56 08/02/2020    CHOLHDL 3.5 08/02/2020   Lab Results  Component Value Date   ALT 11 08/02/2020   AST 23 08/02/2020   PLT 372 01/21/2018   TSH 1.730 05/23/2015     She was last seen for this 6 months ago.  Management since that visit includes starting Crestor '5mg'$  daily.  She reports poor compliance with treatment. She did not start Crestor.  She reports she has incorporated foods to improve her cholesterol. She has added avocado, walnuts, pecans and blueberries.  [name] not having side effects.   Symptoms: No chest pain No chest pressure/discomfort  No dyspnea No lower extremity edema  No numbness or tingling of extremity No orthopnea  No palpitations No paroxysmal nocturnal dyspnea  No speech difficulty No syncope    The ASCVD Risk score ([place]., et al., 2013) failed to calculate for the following reasons:   The 2013 ASCVD risk score is only valid for ages 6 to 81  ---------------------------------------------------------------------------------------------------   Medications: Outpatient Medications Prior to Visit  Medication Sig   aspirin EC 81 MG tablet Take 81 mg by mouth daily. Swallow whole.   MAGNESIUM PO Take 500 mg by mouth 2 (two) times daily.   Multiple Vitamins-Minerals (ICAPS AREDS 2 PO) Take by mouth daily.    OVER THE COUNTER MEDICATION daily. Moringa   OVER THE COUNTER MEDICATION Take 1 capsule by mouth daily. BPS-5  triamterene-hydrochlorothiazide (MAXZIDE-25) 37.5-25 MG tablet TAKE 1 TABLET BY MOUTH EVERY DAY   TURMERIC PO Take by mouth daily. With Curcumin   [DISCONTINUED] rosuvastatin (CRESTOR) 5 MG tablet Take 1 tablet (5 mg total) by mouth daily. (Patient not taking: No sig reported)   No facility-administered medications prior to visit.   Review of Systems  Constitutional: Negative.  Negative for chills, fatigue and fever.  HENT:  Negative for ear pain, sinus pressure, sinus pain and sore throat.   Eyes:  Negative for pain and visual disturbance.   Respiratory: Negative.  Negative for cough, chest tightness, shortness of breath and wheezing.   Cardiovascular: Negative.  Negative for chest pain, palpitations and leg swelling.  Gastrointestinal: Negative.  Negative for abdominal pain, blood in stool, diarrhea, nausea and vomiting.  Genitourinary:  Negative for dysuria, flank pain, frequency, pelvic pain and urgency.  Musculoskeletal:  Negative for back pain, myalgias and neck pain.  Neurological:  Positive for headaches (Chronic issue but pt states it is under good control since starting OCT Magnesium.). Negative for dizziness, weakness, light-headedness and numbness.     Objective    BP (!) 152/82 (BP Location: Left Arm, Patient Position: Sitting, Cuff Size: Normal)   Pulse 74   Wt 96 lb (43.5 kg)   SpO2 98%   BMI 18.75 kg/m     Physical Exam Vitals reviewed.  Constitutional:      General: [name] not in acute distress.    Appearance: Normal appearance. [name] well-developed. [name] not diaphoretic.  HENT:     Head: Normocephalic and atraumatic.  Eyes:     General: No scleral icterus.    Conjunctiva/sclera: Conjunctivae normal.  Neck:     Thyroid: No thyromegaly.  Cardiovascular:     Rate and Rhythm: Normal rate and regular rhythm.     Pulses: Normal pulses.     Heart sounds: Normal heart sounds. No murmur heard. Pulmonary:     Effort: Pulmonary effort is normal. No respiratory distress.     Breath sounds: Normal breath sounds. No wheezing, rhonchi or rales.  Musculoskeletal:     Cervical back: Neck supple.     Right lower leg: No edema.     Left lower leg: No edema.  Lymphadenopathy:     Cervical: No cervical adenopathy.  Skin:    General: Skin is warm and dry.     Findings: No rash.  Neurological:     Mental Status: [name] alert and oriented to person, place, and time. Mental status is at baseline.  Psychiatric:        Mood and Affect: Mood normal.        Behavior: Behavior normal.     No results found for  any visits on 01/31/21.  Assessment & Plan     Problem List Items Addressed This Visit       Cardiovascular and Mediastinum   Essential hypertension - Primary    Elevated here, but h/o white coat hypertension Declines changes in medications today Will monitor home BPs Continue current meds       Relevant Orders   Comprehensive metabolic panel     Other   Hypercholesteremia    Previously very elevated  Not taking crestor, but tryign to cut back on dietary sources Does have h/o TIA Recheck FLP and CMP       Relevant Orders   Comprehensive metabolic panel   Lipid panel   Malnutrition of mild degree (HCC)    Remains borderline  underweight Discussed importance of healthy weight management Discussed diet and exercise         Return in about 6 months (around 08/03/2021) for AWV, chronic disease f/u.      I,Essence Turner,acting as a Education administrator for Lavon Paganini, MD.,have documented all relevant documentation on the behalf of Lavon Paganini, MD,as directed by  Lavon Paganini, MD while in the presence of Lavon Paganini, MD.    I, Lavon Paganini, MD, have reviewed all documentation for this visit. The documentation on 01/31/21 for the exam, diagnosis, procedures, and orders are all accurate and complete.   Anayi Bricco, Dionne Bucy, MD, MPH [facility]

## 2021-01-31 NOTE — Assessment & Plan Note (Signed)
Previously very elevated  Not taking crestor, but tryign to cut back on dietary sources Does have h/o TIA Recheck FLP and CMP

## 2021-01-31 NOTE — Assessment & Plan Note (Signed)
Remains borderline underweight Discussed importance of healthy weight management Discussed diet and exercise

## 2021-01-31 NOTE — Assessment & Plan Note (Signed)
Elevated here, but h/o white coat hypertension Declines changes in medications today Will monitor home BPs Continue current meds

## 2021-02-01 LAB — COMPREHENSIVE METABOLIC PANEL
ALT: 17 IU/L (ref 0–32)
AST: 25 IU/L (ref 0–40)
Albumin/Globulin Ratio: 2 (ref 1.2–2.2)
Albumin: 4.3 g/dL (ref 3.7–4.7)
Alkaline Phosphatase: 60 IU/L (ref 44–121)
BUN/Creatinine Ratio: 16 (ref 12–28)
BUN: 12 mg/dL (ref 8–27)
Bilirubin Total: 0.2 mg/dL (ref 0.0–1.2)
CO2: 26 mmol/L (ref 20–29)
Calcium: 9.3 mg/dL (ref 8.7–10.3)
Chloride: 94 mmol/L — ABNORMAL LOW (ref 96–106)
Creatinine, Ser: 0.74 mg/dL (ref 0.57–1.00)
Globulin, Total: 2.1 g/dL (ref 1.5–4.5)
Glucose: 89 mg/dL (ref 65–99)
Potassium: 4.7 mmol/L (ref 3.5–5.2)
Sodium: 133 mmol/L — ABNORMAL LOW (ref 134–144)
Total Protein: 6.4 g/dL (ref 6.0–8.5)
eGFR: 82 mL/min/{1.73_m2} (ref >59)

## 2021-02-01 LAB — LIPID PANEL
Chol/HDL Ratio: 2.8 ratio (ref 0.0–4.4)
Cholesterol, Total: 241 mg/dL — ABNORMAL HIGH (ref 100–199)
HDL: 87 mg/dL (ref >39)
LDL Chol Calc (NIH): 147 mg/dL — ABNORMAL HIGH (ref 0–99)
Triglycerides: 46 mg/dL (ref 0–149)
VLDL Cholesterol Cal: 7 mg/dL (ref 5–40)

## 2021-03-06 ENCOUNTER — Telehealth: Payer: Self-pay

## 2021-03-06 NOTE — Telephone Encounter (Signed)
Copied from [place] [id number]. Topic: General - Other >> Mar 06, 2021  3:21 PM Mariah Levy A wrote: Reason for CRM: The patient has called to share that they're experiencing difficulty with moving through parking lot and having increasing concerns regarding their mobility   The patient would like to know if it is possible to receive a parking placard that will allow them to park closer to buildings to avoid the mobility issues  Please contact further when possible

## 2021-03-07 NOTE — Telephone Encounter (Signed)
Will leave form up front for pick up at her convenience

## 2021-03-07 NOTE — Telephone Encounter (Signed)
Patient advised.

## 2021-03-25 DIAGNOSIS — H34831 Tributary (branch) retinal vein occlusion, right eye, with macular edema: Secondary | ICD-10-CM | POA: Diagnosis not present

## 2021-04-08 ENCOUNTER — Other Ambulatory Visit: Payer: Self-pay | Admitting: Family Medicine

## 2021-04-08 NOTE — Telephone Encounter (Signed)
Requested Prescriptions  Pending Prescriptions Disp Refills  . triamterene-hydrochlorothiazide (MAXZIDE-25) 37.5-25 MG tablet [Pharmacy Med Name: TRIAMTERENE-HCTZ 37.5-25 MG TB] 90 tablet 1    Sig: TAKE 1 TABLET BY MOUTH EVERY DAY     Cardiovascular: Diuretic Combos Failed - 04/08/2021  8:35 AM      Failed - Na in normal range and within 360 days    Sodium  Date Value Ref Range Status  01/31/2021 133 (L) 134 - 144 mmol/L Final         Failed - Last BP in normal range    BP Readings from Last 1 Encounters:  01/31/21 (!) 152/82         Passed - K in normal range and within 360 days    Potassium  Date Value Ref Range Status  01/31/2021 4.7 3.5 - 5.2 mmol/L Final         Passed - Cr in normal range and within 360 days    Creatinine, Ser  Date Value Ref Range Status  01/31/2021 0.74 0.57 - 1.00 mg/dL Final         Passed - Ca in normal range and within 360 days    Calcium  Date Value Ref Range Status  01/31/2021 9.3 8.7 - 10.3 mg/dL Final         Passed - Valid encounter within last 6 months    Recent Outpatient Visits          2 months ago Essential hypertension   [place], Dionne Bucy, MD   8 months ago Essential hypertension   [place], Dionne Bucy, MD   10 months ago Injury of head, initial encounter   [place], Dionne Bucy, MD   1 year ago Essential hypertension   [facility] [place], Dionne Bucy, MD   3 years ago Medicare annual wellness visit, subsequent   [facility], Dionne Bucy, MD

## 2021-04-15 DIAGNOSIS — H353132 Nonexudative age-related macular degeneration, bilateral, intermediate dry stage: Secondary | ICD-10-CM | POA: Diagnosis not present

## 2021-04-15 DIAGNOSIS — H34831 Tributary (branch) retinal vein occlusion, right eye, with macular edema: Secondary | ICD-10-CM | POA: Diagnosis not present

## 2021-05-13 DIAGNOSIS — H34831 Tributary (branch) retinal vein occlusion, right eye, with macular edema: Secondary | ICD-10-CM | POA: Diagnosis not present

## 2021-06-25 DIAGNOSIS — H34831 Tributary (branch) retinal vein occlusion, right eye, with macular edema: Secondary | ICD-10-CM | POA: Diagnosis not present

## 2021-08-05 ENCOUNTER — Encounter: Payer: MEDICARE | Admitting: Family Medicine

## 2021-08-20 DIAGNOSIS — H34831 Tributary (branch) retinal vein occlusion, right eye, with macular edema: Secondary | ICD-10-CM | POA: Diagnosis not present

## 2021-09-25 ENCOUNTER — Other Ambulatory Visit: Payer: Self-pay | Admitting: Family Medicine

## 2021-10-01 DIAGNOSIS — H34831 Tributary (branch) retinal vein occlusion, right eye, with macular edema: Secondary | ICD-10-CM | POA: Diagnosis not present

## 2021-10-02 NOTE — Progress Notes (Signed)
? ? ? ?I,Roshena L Chambers,acting as a scribe for Lavon Paganini, MD.,have documented all relevant documentation on the behalf of Lavon Paganini, MD,as directed by  Lavon Paganini, MD while in the presence of Lavon Paganini, MD.  ? ?Annual Wellness Visit ? ?  ? ?Patient: Mariah Levy, Female    DOB: Apr 18, 1941, 81 y.o.   MRN: [id number] ?Visit Date: 10/03/2021 ? ?Today's Provider: Lavon Paganini, MD  ? ?Chief Complaint  ?Patient presents with  ? Medicare Wellness  ? ?Subjective  ?  ?Mariah Levy is a 81 y.o. female who presents today for her Annual Wellness Visit. ?She reports consuming a general diet. Home exercise routine includes walking 3 times weekly. She generally feels fairly well. She reports sleeping poorly (trouble staying asleep). She does not have additional problems to discuss today.  ? ?HPI ? ?Home BPs are 110s-120s/60s-70s ? ?Medications: ?Outpatient Medications Prior to Visit  ?Medication Sig  ? aspirin EC 81 MG tablet Take 81 mg by mouth daily. Swallow whole.  ? MAGNESIUM PO Take 500 mg by mouth 2 (two) times daily.  ? Multiple Vitamins-Minerals (ICAPS AREDS 2 PO) Take by mouth daily.   ? OVER THE COUNTER MEDICATION daily. Moringa  ? OVER THE COUNTER MEDICATION Take 1 capsule by mouth daily. BPS-5  ? triamterene-hydrochlorothiazide (MAXZIDE-25) 37.5-25 MG tablet TAKE 1 TABLET BY MOUTH EVERY DAY  ? TURMERIC PO Take by mouth daily. With Curcumin  ? ?No facility-administered medications prior to visit.  ?  ?Allergies  ?Allergen Reactions  ? Atacand  [Candesartan] Shortness Of Breath  ? Iodine Shortness Of Breath  ? Other Anaphylaxis  ?  Melons of any kind.  ? Demerol [Meperidine] Hives  ? Propoxyphene Hives  ?  Darvon, Darvocet  ? Ramipril Swelling  ?  Paresthesias.  ? Penicillins Rash  ? ? ?Patient Care Team: ?Virginia Crews, MD as PCP - General (Family Medicine) ?Leandrew Koyanagi, MD as Referring Physician (Ophthalmology) ? ?Review of Systems  ?Constitutional:  Negative for  chills, fatigue and fever.  ?HENT:  Negative for congestion, ear pain, rhinorrhea, sneezing and sore throat.   ?Eyes: Negative.  Negative for pain and redness.  ?Respiratory:  Negative for cough, shortness of breath and wheezing.   ?Cardiovascular:  Negative for chest pain and leg swelling.  ?Gastrointestinal:  Negative for abdominal pain, blood in stool, constipation, diarrhea and nausea.  ?Endocrine: Negative for polydipsia and polyphagia.  ?Genitourinary: Negative.  Negative for dysuria, flank pain, hematuria, pelvic pain, vaginal bleeding and vaginal discharge.  ?Musculoskeletal:  Negative for arthralgias, back pain, gait problem and joint swelling.  ?Skin:  Negative for rash.  ?Neurological: Negative.  Negative for dizziness, tremors, seizures, weakness, light-headedness, numbness and headaches.  ?Hematological:  Negative for adenopathy.  ?Psychiatric/Behavioral:  Positive for sleep disturbance. Negative for behavioral problems, confusion and dysphoric mood. The patient is not nervous/anxious and is not hyperactive.   ? ? ?  ? Objective  ?  ?Vitals: BP 113/64 Comment: home reading  Pulse 72   Temp 98.1 ?F (36.7 ?C) (Oral)   Resp 12   Ht 5' (1.524 m)   Wt 97 lb 14.4 oz (44.4 kg)   BMI 19.12 kg/m?  ? ? ?Today's Vitals  ? 10/03/21 1525 10/03/21 1528 10/03/21 1540  ?BP: (!) 162/81 (!) 146/81 113/64  ?Pulse: 73  72  ?Resp: 12    ?Temp: 98.1 ?F (36.7 ?C)    ?TempSrc: Oral    ?Weight: 97 lb 14.4 oz (44.4 kg)    ?  Height: 5' (1.524 m)    ? ?Body mass index is 19.12 kg/m?.  ? ?Physical Exam ?Vitals reviewed.  ?Constitutional:   ?   General: [name] not in acute distress. ?   Appearance: Normal appearance. [name] well-developed. [name] not diaphoretic.  ?HENT:  ?   Head: Normocephalic and atraumatic.  ?   Right Ear: Tympanic membrane, ear canal and external ear normal.  ?   Left Ear: Tympanic membrane, ear canal and external ear normal.  ?   Nose: Nose normal.  ?   Mouth/Throat:  ?   Mouth: Mucous membranes are  moist.  ?   Pharynx: Oropharynx is clear. No oropharyngeal exudate.  ?Eyes:  ?   General: No scleral icterus. ?   Conjunctiva/sclera: Conjunctivae normal.  ?   Pupils: Pupils are equal, round, and reactive to light.  ?Neck:  ?   Thyroid: No thyromegaly.  ?Cardiovascular:  ?   Rate and Rhythm: Normal rate and regular rhythm.  ?   Heart sounds: Normal heart sounds. No murmur heard. ?Pulmonary:  ?   Effort: Pulmonary effort is normal. No respiratory distress.  ?   Breath sounds: Normal breath sounds. No wheezing or rales.  ?Abdominal:  ?   General: There is no distension.  ?   Palpations: Abdomen is soft.  ?   Tenderness: There is no abdominal tenderness.  ?Musculoskeletal:     ?   General: No deformity.  ?   Cervical back: Neck supple.  ?   Right lower leg: No edema.  ?   Left lower leg: No edema.  ?Lymphadenopathy:  ?   Cervical: No cervical adenopathy.  ?Skin: ?   General: Skin is warm and dry.  ?Neurological:  ?   Mental Status: [name] alert and oriented to person, place, and time. Mental status is at baseline.  ?   Gait: Gait normal.  ?Psychiatric:     ?   Mood and Affect: Mood normal.     ?   Behavior: Behavior normal.     ?   Thought Content: Thought content normal.  ? ? ? ?Most recent functional status assessment: ? ?  10/03/2021  ?  3:54 AM  ?In your present state of health, do you have any difficulty performing the following activities:  ?Hearing? 0  ?Vision? 0  ?Difficulty concentrating or making decisions? 0  ?Walking or climbing stairs? 1  ?Dressing or bathing? 0  ?Doing errands, shopping? 0  ?Preparing Food and eating ? N  ?Using the Toilet? N  ?In the past six months, have you accidently leaked urine? N  ?Do you have problems with loss of bowel control? N  ?Managing your Medications? N  ?Managing your Finances? N  ?Housekeeping or managing your Housekeeping? N  ? ?Most recent fall risk assessment: ? ?  10/03/2021  ?  3:54 AM  ?Fall Risk   ?Falls in the past year? 1  ?Number falls in past yr: 0  ?Injury with  Fall? 0  ? ? Most recent depression screenings: ? ?  10/03/2021  ?  3:31 PM 01/31/2021  ?  2:29 PM  ?PHQ 2/9 Scores  ?PHQ - 2 Score 0 0  ?PHQ- 9 Score 3 3  ? ?Most recent cognitive screening: ? ?  02/19/2018  ? 10:20 AM  ?6CIT Screen  ?What Year? 0 points  ?What month? 0 points  ?What time? 0 points  ?Count back from 20 0 points  ?Months in reverse 0  points  ?Repeat phrase 2 points  ?Total Score 2 points  ? ?Most recent Audit-C alcohol use screening ? ?  10/03/2021  ?  3:54 AM  ?Alcohol Use Disorder Test (AUDIT)  ?1. How often do you have a drink containing alcohol? 0  ?3. How often do you have six or more drinks on one occasion? 0  ? ?A score of 3 or more in women, and 4 or more in men indicates increased risk for alcohol abuse, EXCEPT if all of the points are from question 1  ? ?No results found for any visits on 10/03/21. ? Assessment & Plan  ?  ? ?Annual wellness visit done today including the all of the following: ?Reviewed patient's Family Medical History ?Reviewed and updated list of patient's medical providers ?Assessment of cognitive impairment was done ?Assessed patient's functional ability ?Established a written schedule for health screening services ?Health Risk Assessent Completed and Reviewed ? ?Exercise Activities and Dietary recommendations ? Goals   ? ?  Exercise 150 minutes per week (moderate activity)   ? ?  ? ? ?Immunization History  ?Administered Date(s) Administered  ? Tdap 04/18/2011  ? ? ?Health Maintenance  ?Topic Date Due  ? COVID-19 Vaccine (1) Never done  ? Zoster Vaccines- Shingrix (1 of 2) Never done  ? Pneumonia Vaccine 59+ Years old (1 - PCV) Never done  ? TETANUS/TDAP  04/17/2021  ? INFLUENZA VACCINE  01/28/2022  ? DEXA SCAN  Completed  ? HPV VACCINES  Aged Out  ? ? ? ?Discussed health benefits of physical activity, and encouraged her to engage in regular exercise appropriate for her age and condition.  ?  ?Problem List Items Addressed This Visit   ? ?  ? Cardiovascular and Mediastinum  ?  Essential hypertension  ?  Well controlled on home readings  ?Continue current medications ?Recheck metabolic panel ?F/u in 6 months  ?  ?  ? Relevant Orders  ? Comprehensive metabolic panel  ? Lipid Panel With

## 2021-10-03 ENCOUNTER — Ambulatory Visit (INDEPENDENT_AMBULATORY_CARE_PROVIDER_SITE_OTHER): Payer: MEDICARE | Admitting: Family Medicine

## 2021-10-03 ENCOUNTER — Encounter: Payer: Self-pay | Admitting: Family Medicine

## 2021-10-03 VITALS — BP 113/64 | HR 72 | Temp 98.1°F | Resp 12 | Ht 60.0 in | Wt 97.9 lb

## 2021-10-03 DIAGNOSIS — I10 Essential (primary) hypertension: Secondary | ICD-10-CM | POA: Diagnosis not present

## 2021-10-03 DIAGNOSIS — E7800 Pure hypercholesterolemia, unspecified: Secondary | ICD-10-CM

## 2021-10-03 DIAGNOSIS — Z0000 Encounter for general adult medical examination without abnormal findings: Secondary | ICD-10-CM

## 2021-10-03 DIAGNOSIS — H348112 Central retinal vein occlusion, right eye, stable: Secondary | ICD-10-CM | POA: Diagnosis not present

## 2021-10-03 NOTE — Assessment & Plan Note (Addendum)
F/b Optho ?Getting injections ?[place] ?Continue risk factor management as with h/o TIAs also ?

## 2021-10-03 NOTE — Assessment & Plan Note (Signed)
Reviewed last lipid panel Not currently on a statin Recheck FLP and CMP Discussed diet and exercise  

## 2021-10-03 NOTE — Patient Instructions (Signed)
Ask at the pharmacy about the tetanus and whooping cough vaccine (TDAP) ?

## 2021-10-03 NOTE — Assessment & Plan Note (Signed)
Well controlled on home readings Continue current medications Recheck metabolic panel F/u in 6 months  

## 2021-10-07 DIAGNOSIS — E7800 Pure hypercholesterolemia, unspecified: Secondary | ICD-10-CM | POA: Diagnosis not present

## 2021-10-07 DIAGNOSIS — I10 Essential (primary) hypertension: Secondary | ICD-10-CM | POA: Diagnosis not present

## 2021-10-08 LAB — COMPREHENSIVE METABOLIC PANEL
ALT: 19 IU/L (ref 0–32)
AST: 28 IU/L (ref 0–40)
Albumin/Globulin Ratio: 2.4 — ABNORMAL HIGH (ref 1.2–2.2)
Albumin: 4.4 g/dL (ref 3.6–4.6)
Alkaline Phosphatase: 58 IU/L (ref 44–121)
BUN/Creatinine Ratio: 17 (ref 12–28)
BUN: 12 mg/dL (ref 8–27)
Bilirubin Total: 0.3 mg/dL (ref 0.0–1.2)
CO2: 26 mmol/L (ref 20–29)
Calcium: 9.8 mg/dL (ref 8.7–10.3)
Chloride: 101 mmol/L (ref 96–106)
Creatinine, Ser: 0.71 mg/dL (ref 0.57–1.00)
Globulin, Total: 1.8 g/dL (ref 1.5–4.5)
Glucose: 79 mg/dL (ref 70–99)
Potassium: 4.8 mmol/L (ref 3.5–5.2)
Sodium: 138 mmol/L (ref 134–144)
Total Protein: 6.2 g/dL (ref 6.0–8.5)
eGFR: 85 mL/min/{1.73_m2} (ref >59)

## 2021-10-08 LAB — LIPID PANEL WITH LDL/HDL RATIO
Cholesterol, Total: 235 mg/dL — ABNORMAL HIGH (ref 100–199)
HDL: 87 mg/dL (ref >39)
LDL Chol Calc (NIH): 141 mg/dL — ABNORMAL HIGH (ref 0–99)
LDL/HDL Ratio: 1.6 ratio (ref 0.0–3.2)
Triglycerides: 43 mg/dL (ref 0–149)
VLDL Cholesterol Cal: 7 mg/dL (ref 5–40)

## 2021-11-12 DIAGNOSIS — H34831 Tributary (branch) retinal vein occlusion, right eye, with macular edema: Secondary | ICD-10-CM | POA: Diagnosis not present

## 2021-12-23 DIAGNOSIS — H34831 Tributary (branch) retinal vein occlusion, right eye, with macular edema: Secondary | ICD-10-CM | POA: Diagnosis not present

## 2021-12-28 ENCOUNTER — Other Ambulatory Visit: Payer: Self-pay | Admitting: Family Medicine

## 2021-12-30 NOTE — Telephone Encounter (Signed)
Requested Prescriptions  Pending Prescriptions Disp Refills  . triamterene-hydrochlorothiazide (MAXZIDE-25) 37.5-25 MG tablet [Pharmacy Med Name: TRIAMTERENE-HCTZ 37.5-25 MG TB] 90 tablet 0    Sig: TAKE 1 TABLET BY MOUTH EVERY DAY     Cardiovascular: Diuretic Combos Passed - 12/28/2021  1:18 AM      Passed - K in normal range and within 180 days    Potassium  Date Value Ref Range Status  10/07/2021 4.8 3.5 - 5.2 mmol/L Final         Passed - Na in normal range and within 180 days    Sodium  Date Value Ref Range Status  10/07/2021 138 134 - 144 mmol/L Final         Passed - Cr in normal range and within 180 days    Creatinine, Ser  Date Value Ref Range Status  10/07/2021 0.71 0.57 - 1.00 mg/dL Final         Passed - Last BP in normal range    BP Readings from Last 1 Encounters:  10/03/21 113/64         Passed - Valid encounter within last 6 months    Recent Outpatient Visits          2 months ago Encounter for annual wellness visit (AWV) in Medicare patient   [facility], Dionne Bucy, MD   11 months ago Essential hypertension   [facility], Dionne Bucy, MD   1 year ago Essential hypertension   [place], Dionne Bucy, MD   1 year ago Injury of head, initial encounter   [place], Dionne Bucy, MD   2 years ago Essential hypertension   [place], Dionne Bucy, MD      Future Appointments            In 3 months Bac[place], Dionne Bucy, MD [facility], [place]

## 2022-01-23 ENCOUNTER — Ambulatory Visit (INDEPENDENT_AMBULATORY_CARE_PROVIDER_SITE_OTHER): Payer: MEDICARE | Admitting: Family Medicine

## 2022-01-23 ENCOUNTER — Encounter: Payer: Self-pay | Admitting: Family Medicine

## 2022-01-23 VITALS — BP 133/75 | HR 78 | Temp 98.2°F | Resp 16 | Ht 60.0 in | Wt 98.6 lb

## 2022-01-23 DIAGNOSIS — R0989 Other specified symptoms and signs involving the circulatory and respiratory systems: Secondary | ICD-10-CM | POA: Diagnosis not present

## 2022-01-23 DIAGNOSIS — E7800 Pure hypercholesterolemia, unspecified: Secondary | ICD-10-CM | POA: Diagnosis not present

## 2022-01-23 DIAGNOSIS — Z8673 Personal history of transient ischemic attack (TIA), and cerebral infarction without residual deficits: Secondary | ICD-10-CM | POA: Diagnosis not present

## 2022-01-23 DIAGNOSIS — R54 Age-related physical debility: Secondary | ICD-10-CM | POA: Insufficient documentation

## 2022-01-23 DIAGNOSIS — H348312 Tributary (branch) retinal vein occlusion, right eye, stable: Secondary | ICD-10-CM | POA: Diagnosis not present

## 2022-01-23 NOTE — Assessment & Plan Note (Signed)
Previously seen by neuro No residuals outside of visual occlusion of R retinal f/b opthal

## 2022-01-23 NOTE — Assessment & Plan Note (Signed)
Chronic, stable Receives injections by opthal every 6 weeks Strict BP and HLD control given hx of TIAs

## 2022-01-23 NOTE — Assessment & Plan Note (Signed)
Very conscious with diet Limited exercise d/t weakness/vertigo Hx of TIAs Goal of 55-70

## 2022-01-23 NOTE — Assessment & Plan Note (Signed)
Generalized weakness noted; 2/5 strength Concern for safety in current living environment Request for form completion to assist with VA funds for use of retirement community

## 2022-01-23 NOTE — Progress Notes (Signed)
Established patient visit  I,Joseline E Rosas,acting as a scribe for Gwyneth Sprout, FNP.,have documented all relevant documentation on the behalf of Gwyneth Sprout, FNP,as directed by  Gwyneth Sprout, FNP while in the presence of Gwyneth Sprout, FNP.   Patient: Mariah Levy   DOB: 30-Oct-1940   81 y.o. Female  MRN: [id number] Visit Date: 01/23/2022  Today's healthcare provider: Gwyneth Sprout, FNP  Introduced to nurse practitioner role and practice setting.  All questions answered.  Discussed provider/patient relationship and expectations.   Chief Complaint  Patient presents with   Forms   Subjective    HPI  Patient here needing forms for VA to be filled out.Reports that she is looking to go into a retirement facility.   Medications: Outpatient Medications Prior to Visit  Medication Sig   aspirin EC 81 MG tablet Take 81 mg by mouth daily. Swallow whole.   MAGNESIUM PO Take 500 mg by mouth 2 (two) times daily.   Multiple Vitamins-Minerals (ICAPS AREDS 2 PO) Take by mouth daily.    OVER THE COUNTER MEDICATION daily. Moringa   OVER THE COUNTER MEDICATION Take 1 capsule by mouth daily. BPS-5   triamterene-hydrochlorothiazide (MAXZIDE-25) 37.5-25 MG tablet TAKE 1 TABLET BY MOUTH EVERY DAY   TURMERIC PO Take by mouth daily. With Curcumin   No facility-administered medications prior to visit.    Review of Systems  Constitutional:  Negative for appetite change, chills, fatigue and fever.  Respiratory:  Negative for chest tightness and shortness of breath.   Cardiovascular:  Negative for chest pain and palpitations.  Gastrointestinal:  Negative for abdominal pain, nausea and vomiting.  Neurological:  Negative for dizziness and weakness.     Objective    BP 133/75 (BP Location: Left Arm, Patient Position: Sitting, Cuff Size: Normal)   Pulse 78   Temp 98.2 F (36.8 C) (Oral)   Resp 16   Ht 5' (1.524 m)   Wt 98 lb 9.6 oz (44.7 kg)   BMI 19.26 kg/m    Physical Exam Vitals  and nursing note reviewed.  Constitutional:      General: [name] not in acute distress.    Appearance: Normal appearance. [name] normal weight. [name] not ill-appearing, toxic-appearing or diaphoretic.  HENT:     Head: Normocephalic and atraumatic.  Eyes:     Comments: Use of glasses; followed by [facility]  Neck:     Vascular: Carotid bruit present.     Comments: R carotid bruit Cardiovascular:     Rate and Rhythm: Normal rate and regular rhythm.     Pulses: Normal pulses.     Heart sounds: Normal heart sounds. No murmur heard.    No friction rub. No gallop.  Pulmonary:     Effort: Pulmonary effort is normal. No respiratory distress.     Breath sounds: Normal breath sounds. No stridor. No wheezing, rhonchi or rales.  Chest:     Chest wall: No tenderness.  Abdominal:     General: Bowel sounds are normal.     Palpations: Abdomen is soft.  Musculoskeletal:        General: No swelling, tenderness, deformity or signs of injury. Normal range of motion.     Right lower leg: No edema.     Left lower leg: No edema.  Skin:    General: Skin is warm and dry.     Capillary Refill: Capillary refill takes less than 2 seconds.  Coloration: Skin is not jaundiced or pale.     Findings: No bruising, erythema, lesion or rash.  Neurological:     General: No focal deficit present.     Mental Status: [name] alert and oriented to person, place, and time. Mental status is at baseline.     GCS: GCS eye subscore is 4. GCS verbal subscore is 5. GCS motor subscore is 6.     Cranial Nerves: No cranial nerve deficit.     Sensory: No sensory deficit.     Motor: Weakness present.     Coordination: Coordination is intact. Coordination normal.     Gait: Gait abnormal.     Deep Tendon Reflexes: Reflexes are normal and symmetric.     Comments: 2/5 R and L upper strength  2/5 R and L lower strength  Guarded gait; use of walls, structures to correct balance   Psychiatric:        Mood and Affect:  Mood normal.        Behavior: Behavior normal.        Thought Content: Thought content normal.        Judgment: Judgment normal.      No results found for any visits on 01/23/22.  Assessment & Plan     Problem List Items Addressed This Visit       Cardiovascular and Mediastinum   Retinal vein occlusion of right eye - Primary    Chronic, stable Receives injections by opthal every 6 weeks Strict BP and HLD control given hx of TIAs      Relevant Orders   US Carotid Bilateral     Other   Hx of TIA (transient ischemic attack) and stroke    Previously seen by neuro No residuals outside of visual occlusion of R retinal f/b opthal       Relevant Orders   US Carotid Bilateral   Hypercholesteremia    Very conscious with diet Limited exercise d/t weakness/vertigo Hx of TIAs Goal of 55-70      Senile asthenia    Generalized weakness noted; 2/5 strength Concern for safety in current living environment Request for form completion to assist with VA funds for use of retirement community       Other Visit Diagnoses     Bruit of right carotid artery       Relevant Orders   US Carotid Bilateral        Return if symptoms worsen or fail to improve.      Vonna Kotyk, FNP, have reviewed all documentation for this visit. The documentation on 01/23/22 for the exam, diagnosis, procedures, and orders are all accurate and complete.    Gwyneth Sprout, [place] [phone] (phone) [phone] (fax)  [place]

## 2022-02-03 DIAGNOSIS — H34831 Tributary (branch) retinal vein occlusion, right eye, with macular edema: Secondary | ICD-10-CM | POA: Diagnosis not present

## 2022-03-17 DIAGNOSIS — H34831 Tributary (branch) retinal vein occlusion, right eye, with macular edema: Secondary | ICD-10-CM | POA: Diagnosis not present

## 2022-03-31 ENCOUNTER — Other Ambulatory Visit: Payer: Self-pay | Admitting: Family Medicine

## 2022-04-04 ENCOUNTER — Ambulatory Visit (INDEPENDENT_AMBULATORY_CARE_PROVIDER_SITE_OTHER): Payer: MEDICARE | Admitting: Family Medicine

## 2022-04-04 ENCOUNTER — Encounter: Payer: Self-pay | Admitting: Family Medicine

## 2022-04-04 VITALS — BP 136/77 | HR 76 | Temp 98.4°F | Resp 16 | Wt 94.8 lb

## 2022-04-04 DIAGNOSIS — L240 Irritant contact dermatitis due to detergents: Secondary | ICD-10-CM | POA: Diagnosis not present

## 2022-04-04 DIAGNOSIS — E7800 Pure hypercholesterolemia, unspecified: Secondary | ICD-10-CM

## 2022-04-04 DIAGNOSIS — I10 Essential (primary) hypertension: Secondary | ICD-10-CM | POA: Diagnosis not present

## 2022-04-04 MED ORDER — TRIAMCINOLONE ACETONIDE 0.5 % EX OINT: 1.0000 | TOPICAL_OINTMENT | Freq: Two times a day (BID) | CUTANEOUS | 2 refills | Status: DC

## 2022-04-04 NOTE — Assessment & Plan Note (Signed)
New problem She did just recently switch her laundry detergent Discussed switching to a free and clear detergent Triamcinolone ointment twice daily as needed

## 2022-04-04 NOTE — Assessment & Plan Note (Signed)
Well controlled Continue current medications Recheck metabolic panel F/u in 6 months  

## 2022-04-04 NOTE — Assessment & Plan Note (Signed)
Reviewed last lipid panel Not currently on a statin Recheck FLP and CMP annually-at next visit Discussed diet and exercise

## 2022-04-04 NOTE — Progress Notes (Signed)
I,Sulibeya S Dimas,acting as a Education administrator for Lavon Paganini, MD.,have documented all relevant documentation on the behalf of Lavon Paganini, MD,as directed by  Lavon Paganini, MD while in the presence of Lavon Paganini, MD.     Established patient visit   Patient: Mariah Levy   DOB: 01-10-1941   81 y.o. Female  MRN: [id number] Visit Date: 04/04/2022  Today's healthcare provider: Lavon Paganini, MD   Chief Complaint  Patient presents with   Hypertension   Rash   Subjective    HPI  Hypertension, follow-up  BP Readings from Last 3 Encounters:  04/04/22 136/77  01/23/22 133/75  10/03/21 113/64   Wt Readings from Last 3 Encounters:  04/04/22 94 lb 12.8 oz (43 kg)  01/23/22 98 lb 9.6 oz (44.7 kg)  10/03/21 97 lb 14.4 oz (44.4 kg)     She was last seen for hypertension 6 months ago.  BP at that visit was 113/64. Management since that visit includes No changes.  She reports excellent compliance with treatment. [name] not having side effects.  [name] following a Regular diet. [name] exercising. She does not smoke.  Use of agents associated with hypertension: none.   Outside blood pressures are stable. Symptoms: No chest pain No chest pressure  No palpitations No syncope  No dyspnea No orthopnea  No paroxysmal nocturnal dyspnea No lower extremity edema   Pertinent labs Lab Results  Component Value Date   CHOL 235 (H) 10/07/2021   HDL 87 10/07/2021   LDLCALC 141 (H) 10/07/2021   TRIG 43 10/07/2021   CHOLHDL 2.8 01/31/2021   Lab Results  Component Value Date   NA 138 10/07/2021   K 4.8 10/07/2021   CREATININE 0.71 10/07/2021   EGFR 85 10/07/2021   GLUCOSE 79 10/07/2021   TSH 1.730 05/23/2015     The ASCVD Risk score (Arnett DK, et al., 2019) failed to calculate for the following reasons:   The 2019 ASCVD risk score is only valid for ages 76 to  52  --------------------------------------------------------------------------------------------------- Patient C/O skin irritation and itching on back and arms. She has been using OTC lotions, reports mild relief.  Had used a new laundry detergent.    Patient requesting a letter stating need of emotional support for her dog. She would like to take her dog with her on trips and hotels.   Has moved out of her house and into an apartment  Medications: Outpatient Medications Prior to Visit  Medication Sig   aspirin EC 81 MG tablet Take 81 mg by mouth daily. Swallow whole.   MAGNESIUM PO Take 500 mg by mouth 2 (two) times daily.   Multiple Vitamins-Minerals (ICAPS AREDS 2 PO) Take by mouth daily.    OVER THE COUNTER MEDICATION daily. Moringa   OVER THE COUNTER MEDICATION Take 1 capsule by mouth daily. BPS-5   triamterene-hydrochlorothiazide (MAXZIDE-25) 37.5-25 MG tablet TAKE 1 TABLET BY MOUTH EVERY DAY   TURMERIC PO Take by mouth daily. With Curcumin   No facility-administered medications prior to visit.    Review of Systems  Constitutional:  Negative for appetite change and fatigue.  Respiratory:  Negative for chest tightness and shortness of breath.   Cardiovascular:  Negative for chest pain, palpitations and leg swelling.  Gastrointestinal:  Negative for abdominal pain, nausea and vomiting.  Skin:  Positive for rash.       Objective    BP 136/77 (BP Location: Left Arm, Patient Position: Sitting, Cuff Size: Normal)   Pulse  76   Temp 98.4 F (36.9 C) (Oral)   Resp 16   Wt 94 lb 12.8 oz (43 kg)   BMI 18.51 kg/m  BP Readings from Last 3 Encounters:  04/04/22 136/77  01/23/22 133/75  10/03/21 113/64   Wt Readings from Last 3 Encounters:  04/04/22 94 lb 12.8 oz (43 kg)  01/23/22 98 lb 9.6 oz (44.7 kg)  10/03/21 97 lb 14.4 oz (44.4 kg)      Physical Exam Vitals reviewed.  Constitutional:      General: [name] not in acute distress.    Appearance: Normal appearance.  [name] well-developed. [name] not diaphoretic.  HENT:     Head: Normocephalic and atraumatic.  Eyes:     General: No scleral icterus.    Conjunctiva/sclera: Conjunctivae normal.  Neck:     Thyroid: No thyromegaly.  Cardiovascular:     Rate and Rhythm: Normal rate and regular rhythm.     Pulses: Normal pulses.     Heart sounds: Normal heart sounds. No murmur heard. Pulmonary:     Effort: Pulmonary effort is normal. No respiratory distress.     Breath sounds: Normal breath sounds. No wheezing, rhonchi or rales.  Musculoskeletal:     Cervical back: Neck supple.     Right lower leg: No edema.     Left lower leg: No edema.  Lymphadenopathy:     Cervical: No cervical adenopathy.  Skin:    General: Skin is warm and dry.     Findings: No rash.  Neurological:     Mental Status: [name] alert and oriented to person, place, and time. Mental status is at baseline.  Psychiatric:        Mood and Affect: Mood normal.        Behavior: Behavior normal.       No results found for any visits on 04/04/22.  Assessment & Plan     Problem List Items Addressed This Visit       Cardiovascular and Mediastinum   Essential hypertension - Primary    Well controlled Continue current medications Recheck metabolic panel F/u in 6 months       Relevant Orders   Basic Metabolic Panel (BMET)     Musculoskeletal and Integument   Irritant contact dermatitis due to detergent    New problem She did just recently switch her laundry detergent Discussed switching to a free and clear detergent Triamcinolone ointment twice daily as needed        Other   Hypercholesteremia    Reviewed last lipid panel Not currently on a statin Recheck FLP and CMP annually-at next visit Discussed diet and exercise         Return in about 6 months (around 10/04/2022) for CPE, AWV.      I, Lavon Paganini, MD, have reviewed all documentation for this visit. The documentation on 04/04/22 for the exam,  diagnosis, procedures, and orders are all accurate and complete.   Maricsa Sammons, Dionne Bucy, MD, MPH [facility]

## 2022-04-14 LAB — BASIC METABOLIC PANEL

## 2022-04-15 ENCOUNTER — Telehealth: Payer: Self-pay

## 2022-04-15 DIAGNOSIS — I10 Essential (primary) hypertension: Secondary | ICD-10-CM

## 2022-04-15 NOTE — Telephone Encounter (Signed)
-----   Message from Virginia Crews, MD sent at 04/14/2022  8:08 AM EDT ----- Lab was cancelled apparently.  We can try to redraw and submit again if ok with patient.

## 2022-04-28 DIAGNOSIS — H34831 Tributary (branch) retinal vein occlusion, right eye, with macular edema: Secondary | ICD-10-CM | POA: Diagnosis not present

## 2022-06-09 DIAGNOSIS — H34831 Tributary (branch) retinal vein occlusion, right eye, with macular edema: Secondary | ICD-10-CM | POA: Diagnosis not present

## 2022-07-05 ENCOUNTER — Other Ambulatory Visit: Payer: Self-pay | Admitting: Family Medicine

## 2022-07-07 NOTE — Telephone Encounter (Signed)
Requested Prescriptions  Pending Prescriptions Disp Refills   triamterene-hydrochlorothiazide (MAXZIDE-25) 37.5-25 MG tablet [Pharmacy Med Name: TRIAMTERENE-HCTZ 37.5-25 MG TB] 90 tablet 0    Sig: TAKE 1 TABLET BY MOUTH EVERY DAY     Cardiovascular: Diuretic Combos Passed - 07/05/2022  2:38 AM      Passed - K in normal range and within 180 days    Potassium  Date Value Ref Range Status  04/04/2022 CANCELED      Comment:    Test not performed  Result canceled by the ancillary.          Passed - Na in normal range and within 180 days    Sodium  Date Value Ref Range Status  04/04/2022 CANCELED      Comment:    Test not performed  Result canceled by the ancillary.          Passed - Cr in normal range and within 180 days    Creatinine, Ser  Date Value Ref Range Status  04/04/2022 CANCELED      Comment:    Test not performed  Result canceled by the ancillary.          Passed - Last BP in normal range    BP Readings from Last 1 Encounters:  04/04/22 136/77         Passed - Valid encounter within last 6 months    Recent Outpatient Visits           3 months ago Essential hypertension   [facility] [place], Dionne Bucy, MD   5 months ago Stable branch retinal vein occlusion of right eye   [facility] Gwyneth Sprout, FNP   9 months ago Encounter for annual wellness visit (AWV) in Medicare patient   [facility] [place], Dionne Bucy, MD   1 year ago Essential hypertension   [place], Dionne Bucy, MD   1 year ago Essential hypertension   [facility] Wallace, Dionne Bucy, MD

## 2022-07-28 DIAGNOSIS — H34831 Tributary (branch) retinal vein occlusion, right eye, with macular edema: Secondary | ICD-10-CM | POA: Diagnosis not present

## 2022-09-15 DIAGNOSIS — H34831 Tributary (branch) retinal vein occlusion, right eye, with macular edema: Secondary | ICD-10-CM | POA: Diagnosis not present

## 2022-09-24 ENCOUNTER — Telehealth: Payer: Self-pay

## 2022-09-24 NOTE — Telephone Encounter (Signed)
Copied from [place] [id number]. Topic: Medicare AWV >> Sep 24, 2022  9:41 AM Everette C wrote: Reason for CRM: The patient would like to be contacted to reschedule their AWV  Please contact the patient further when possible

## 2022-10-06 ENCOUNTER — Encounter: Payer: MEDICARE | Admitting: Family Medicine

## 2022-10-06 ENCOUNTER — Other Ambulatory Visit: Payer: Self-pay | Admitting: Family Medicine

## 2022-10-20 DIAGNOSIS — H6123 Impacted cerumen, bilateral: Secondary | ICD-10-CM | POA: Diagnosis not present

## 2022-10-20 DIAGNOSIS — H903 Sensorineural hearing loss, bilateral: Secondary | ICD-10-CM | POA: Diagnosis not present

## 2022-10-22 DIAGNOSIS — H1132 Conjunctival hemorrhage, left eye: Secondary | ICD-10-CM | POA: Diagnosis not present

## 2022-10-22 DIAGNOSIS — Z961 Presence of intraocular lens: Secondary | ICD-10-CM | POA: Diagnosis not present

## 2022-11-03 DIAGNOSIS — H34831 Tributary (branch) retinal vein occlusion, right eye, with macular edema: Secondary | ICD-10-CM | POA: Diagnosis not present

## 2022-11-11 ENCOUNTER — Ambulatory Visit (INDEPENDENT_AMBULATORY_CARE_PROVIDER_SITE_OTHER): Payer: MEDICARE | Admitting: Family Medicine

## 2022-11-11 ENCOUNTER — Encounter: Payer: Self-pay | Admitting: Family Medicine

## 2022-11-11 VITALS — BP 136/82 | HR 77 | Temp 98.2°F | Resp 12 | Wt 98.3 lb

## 2022-11-11 DIAGNOSIS — E7800 Pure hypercholesterolemia, unspecified: Secondary | ICD-10-CM

## 2022-11-11 DIAGNOSIS — H348312 Tributary (branch) retinal vein occlusion, right eye, stable: Secondary | ICD-10-CM

## 2022-11-11 DIAGNOSIS — Z0000 Encounter for general adult medical examination without abnormal findings: Secondary | ICD-10-CM

## 2022-11-11 DIAGNOSIS — I10 Essential (primary) hypertension: Secondary | ICD-10-CM

## 2022-11-11 NOTE — Assessment & Plan Note (Signed)
Reviewed last lipid panel Not currently on a statin Recheck FLP and CMP Discussed diet and exercise  

## 2022-11-11 NOTE — Assessment & Plan Note (Signed)
F/b Optho ?Getting injections ?[facility] ?Continue risk factor management as with h/o TIAs also ?

## 2022-11-11 NOTE — Progress Notes (Signed)
I,Sulibeya S Dimas,acting as a Neurosurgeon for Shirlee Latch, MD.,have documented all relevant documentation on the behalf of Shirlee Latch, MD,as directed by  Shirlee Latch, MD while in the presence of Shirlee Latch, MD.    Annual Wellness Visit     Patient: Mariah Levy, Female    DOB: Oct 30, 1940, 82 y.o.   MRN: [id number] Visit Date: 11/11/2022  Today's Provider: Shirlee Latch, MD   Chief Complaint  Patient presents with   Annual Exam   Subjective    Mariah Levy is a 82 y.o. female who presents today for her Annual Wellness Visit. She reports consuming a general diet. Home exercise routine includes walking 6 hrs per week. She generally feels well. She reports sleeping fairly well. She does not have additional problems to discuss today.   HPI Shingrix vaccine-refuse Pneumonia vaccine-refused today.  Medications: Outpatient Medications Prior to Visit  Medication Sig   aspirin EC 81 MG tablet Take 81 mg by mouth daily. Swallow whole.   MAGNESIUM PO Take 500 mg by mouth 2 (two) times daily.   Multiple Vitamins-Minerals (ICAPS AREDS 2 PO) Take by mouth daily.    OVER THE COUNTER MEDICATION daily. Moringa   OVER THE COUNTER MEDICATION Take 1 capsule by mouth daily. BPS-5   triamcinolone ointment (KENALOG) 0.5 % Apply 1 Application topically 2 (two) times daily.   triamterene-hydrochlorothiazide (MAXZIDE-25) 37.5-25 MG tablet TAKE 1 TABLET BY MOUTH EVERY DAY   TURMERIC PO Take by mouth daily. With Curcumin   No facility-administered medications prior to visit.    Allergies  Allergen Reactions   Atacand  [Candesartan] Shortness Of Breath   Iodine Shortness Of Breath   Other Anaphylaxis    Melons of any kind.   Demerol [Meperidine] Hives   Propoxyphene Hives    Darvon, Darvocet   Ramipril Swelling    Paresthesias.   Penicillins Rash    Patient Care Team: Erasmo Downer, MD as PCP - General (Family Medicine) Lockie Mola, MD as  Referring Physician (Ophthalmology)  Review of Systems  All other systems reviewed and are negative.   Last CBC Lab Results  Component Value Date   WBC 5.4 01/21/2018   HGB 12.9 01/21/2018   HCT 38.8 01/21/2018   MCV 93 01/21/2018   MCH 30.8 01/21/2018   RDW 12.5 01/21/2018   PLT 372 01/21/2018   Last metabolic panel Lab Results  Component Value Date   GLUCOSE CANCELED 04/04/2022   NA CANCELED 04/04/2022   K CANCELED 04/04/2022   CL CANCELED 04/04/2022   CO2 CANCELED 04/04/2022   BUN CANCELED 04/04/2022   CREATININE CANCELED 04/04/2022   EGFR 85 10/07/2021   CALCIUM CANCELED 04/04/2022   PHOS 3.5 12/04/2016   PROT 6.2 10/07/2021   ALBUMIN 4.4 10/07/2021   LABGLOB 1.8 10/07/2021   AGRATIO 2.4 (H) 10/07/2021   BILITOT 0.3 10/07/2021   ALKPHOS 58 10/07/2021   AST 28 10/07/2021   ALT 19 10/07/2021   ANIONGAP 9 06/08/2015   Last lipids Lab Results  Component Value Date   CHOL 235 (H) 10/07/2021   HDL 87 10/07/2021   LDLCALC 141 (H) 10/07/2021   TRIG 43 10/07/2021   CHOLHDL 2.8 01/31/2021   Last hemoglobin A1c Lab Results  Component Value Date   HGBA1C 5.2 12/04/2016   Last thyroid functions Lab Results  Component Value Date   TSH 1.730 05/23/2015       Objective    Vitals: BP 136/82 (BP Location: Left Arm, Patient Position: Sitting,  Cuff Size: Normal)   Pulse 77   Temp 98.2 F (36.8 C) (Temporal)   Resp 12   Wt 98 lb 4.8 oz (44.6 kg)   SpO2 98%   BMI 19.20 kg/m  BP Readings from Last 3 Encounters:  11/11/22 136/82  04/04/22 136/77  01/23/22 133/75   Wt Readings from Last 3 Encounters:  11/11/22 98 lb 4.8 oz (44.6 kg)  04/04/22 94 lb 12.8 oz (43 kg)  01/23/22 98 lb 9.6 oz (44.7 kg)       Physical Exam Vitals reviewed.  Constitutional:      General: [name] not in acute distress.    Appearance: Normal appearance. [name] well-developed. [name] not diaphoretic.  HENT:     Head: Normocephalic and atraumatic.     Right Ear: Tympanic  membrane, ear canal and external ear normal.     Left Ear: Tympanic membrane, ear canal and external ear normal.     Nose: Nose normal.     Mouth/Throat:     Mouth: Mucous membranes are moist.     Pharynx: Oropharynx is clear. No oropharyngeal exudate.  Eyes:     General: No scleral icterus.    Conjunctiva/sclera: Conjunctivae normal.     Pupils: Pupils are equal, round, and reactive to light.  Neck:     Thyroid: No thyromegaly.  Cardiovascular:     Rate and Rhythm: Normal rate and regular rhythm.     Heart sounds: Normal heart sounds. No murmur heard. Pulmonary:     Effort: Pulmonary effort is normal. No respiratory distress.     Breath sounds: Normal breath sounds. No wheezing or rales.  Abdominal:     General: There is no distension.     Palpations: Abdomen is soft.     Tenderness: There is no abdominal tenderness.  Musculoskeletal:        General: No deformity.     Cervical back: Neck supple.     Right lower leg: No edema.     Left lower leg: No edema.  Lymphadenopathy:     Cervical: No cervical adenopathy.  Skin:    General: Skin is warm and dry.     Findings: No rash.  Neurological:     Mental Status: [name] alert and oriented to person, place, and time. Mental status is at baseline.     Gait: Gait normal.  Psychiatric:        Mood and Affect: Mood normal.        Behavior: Behavior normal.        Thought Content: Thought content normal.      Most recent functional status assessment:    11/11/2022    1:18 PM  In your present state of health, do you have any difficulty performing the following activities:  Hearing? 0  Vision? 0  Difficulty concentrating or making decisions? 0  Walking or climbing stairs? 0  Dressing or bathing? 0  Doing errands, shopping? 0   Most recent fall risk assessment:    11/11/2022    1:18 PM  Fall Risk   Falls in the past year? 0  Number falls in past yr: 0  Injury with Fall? 0  Risk for fall due to : No Fall Risks  Follow up  Falls evaluation completed    Most recent depression screenings:    11/11/2022    1:18 PM 04/04/2022   10:50 AM  PHQ 2/9 Scores  PHQ - 2 Score 0 0  PHQ- 9 Score  2 1   Most recent cognitive screening:    02/19/2018   10:20 AM  6CIT Screen  What Year? 0 points  What month? 0 points  What time? 0 points  Count back from 20 0 points  Months in reverse 0 points  Repeat phrase 2 points  Total Score 2 points   Most recent Audit-C alcohol use screening    11/11/2022    1:18 PM  Alcohol Use Disorder Test (AUDIT)  1. How often do you have a drink containing alcohol? 0  2. How many drinks containing alcohol do you have on a typical day when you are drinking? 0  3. How often do you have six or more drinks on one occasion? 0  AUDIT-C Score 0   A score of 3 or more in women, and 4 or more in men indicates increased risk for alcohol abuse, EXCEPT if all of the points are from question 1   No results found for any visits on 11/11/22.  Assessment & Plan     Annual wellness visit done today including the all of the following: Reviewed patient's Family Medical History Reviewed and updated list of patient's medical providers Assessment of cognitive impairment was done Assessed patient's functional ability Established a written schedule for health screening services Health Risk Assessent Completed and Reviewed  Exercise Activities and Dietary recommendations  Goals      Exercise 150 minutes per week (moderate activity)        Immunization History  Administered Date(s) Administered   Tdap 04/18/2011    Health Maintenance  Topic Date Due   COVID-19 Vaccine (1) Never done   Zoster Vaccines- Shingrix (1 of 2) Never done   Pneumonia Vaccine 54+ Years old (1 of 1 - PCV) Never done   DTaP/Tdap/Td (2 - Td or Tdap) 04/17/2021   INFLUENZA VACCINE  01/29/2023   Medicare Annual Wellness (AWV)  11/11/2023   DEXA SCAN  Completed   HPV VACCINES  Aged Out     Discussed health  benefits of physical activity, and encouraged her to engage in regular exercise appropriate for her age and condition.    Problem List Items Addressed This Visit       Cardiovascular and Mediastinum   Essential hypertension    Well controlled Continue current medications Recheck metabolic panel F/u in 6 months       Relevant Orders   Comprehensive metabolic panel   Lipid panel   Retinal vein occlusion of right eye    F/b Optho Getting injections [facility] Continue risk factor management as with h/o TIAs also        Other   Hypercholesteremia    Reviewed last lipid panel Not currently on a statin Recheck FLP and CMP Discussed diet and exercise       Relevant Orders   Comprehensive metabolic panel   Lipid panel   Other Visit Diagnoses     Encounter for annual wellness visit (AWV) in Medicare patient    -  Primary        Return in about 6 months (around 05/14/2023) for chronic disease f/u.     I, Shirlee Latch, MD, have reviewed all documentation for this visit. The documentation on 11/11/22 for the exam, diagnosis, procedures, and orders are all accurate and complete.   Suriah Peragine, Marzella Schlein, MD, MPH [facility]

## 2022-11-11 NOTE — Assessment & Plan Note (Signed)
Well controlled Continue current medications Recheck metabolic panel F/u in 6 months  

## 2022-11-12 DIAGNOSIS — I10 Essential (primary) hypertension: Secondary | ICD-10-CM | POA: Diagnosis not present

## 2022-11-12 DIAGNOSIS — E7800 Pure hypercholesterolemia, unspecified: Secondary | ICD-10-CM | POA: Diagnosis not present

## 2022-11-13 LAB — LIPID PANEL
Chol/HDL Ratio: 2.6 ratio (ref 0.0–4.4)
Cholesterol, Total: 232 mg/dL — ABNORMAL HIGH (ref 100–199)
HDL: 90 mg/dL (ref >39)
LDL Chol Calc (NIH): 134 mg/dL — ABNORMAL HIGH (ref 0–99)
Triglycerides: 47 mg/dL (ref 0–149)
VLDL Cholesterol Cal: 8 mg/dL (ref 5–40)

## 2022-11-13 LAB — COMPREHENSIVE METABOLIC PANEL
ALT: 14 IU/L (ref 0–32)
AST: 24 IU/L (ref 0–40)
Albumin/Globulin Ratio: 2 (ref 1.2–2.2)
Albumin: 4.3 g/dL (ref 3.7–4.7)
Alkaline Phosphatase: 50 IU/L (ref 44–121)
BUN/Creatinine Ratio: 20 (ref 12–28)
BUN: 15 mg/dL (ref 8–27)
Bilirubin Total: 0.2 mg/dL (ref 0.0–1.2)
CO2: 25 mmol/L (ref 20–29)
Calcium: 9.8 mg/dL (ref 8.7–10.3)
Chloride: 104 mmol/L (ref 96–106)
Creatinine, Ser: 0.76 mg/dL (ref 0.57–1.00)
Globulin, Total: 2.2 g/dL (ref 1.5–4.5)
Glucose: 82 mg/dL (ref 70–99)
Potassium: 4.6 mmol/L (ref 3.5–5.2)
Sodium: 141 mmol/L (ref 134–144)
Total Protein: 6.5 g/dL (ref 6.0–8.5)
eGFR: 78 mL/min/{1.73_m2} (ref >59)

## 2022-11-20 ENCOUNTER — Telehealth: Payer: Self-pay | Admitting: *Deleted

## 2022-11-20 NOTE — Telephone Encounter (Signed)
Pt given lab results per notes of Dr. Beryle Flock from 11/12/22 on 11/20/22. Pt verbalized understanding.

## 2022-12-29 DIAGNOSIS — H34831 Tributary (branch) retinal vein occlusion, right eye, with macular edema: Secondary | ICD-10-CM | POA: Diagnosis not present

## 2023-01-02 ENCOUNTER — Other Ambulatory Visit: Payer: Self-pay | Admitting: Family Medicine

## 2023-01-30 DIAGNOSIS — L821 Other seborrheic keratosis: Secondary | ICD-10-CM | POA: Diagnosis not present

## 2023-01-30 DIAGNOSIS — L570 Actinic keratosis: Secondary | ICD-10-CM | POA: Diagnosis not present

## 2023-03-03 DIAGNOSIS — H34831 Tributary (branch) retinal vein occlusion, right eye, with macular edema: Secondary | ICD-10-CM | POA: Diagnosis not present

## 2023-03-19 DIAGNOSIS — L720 Epidermal cyst: Secondary | ICD-10-CM | POA: Diagnosis not present

## 2023-03-29 ENCOUNTER — Other Ambulatory Visit: Payer: Self-pay | Admitting: Family Medicine

## 2023-04-27 ENCOUNTER — Encounter: Payer: Self-pay | Admitting: Family Medicine

## 2023-04-27 ENCOUNTER — Ambulatory Visit (INDEPENDENT_AMBULATORY_CARE_PROVIDER_SITE_OTHER): Payer: MEDICARE | Admitting: Family Medicine

## 2023-04-27 ENCOUNTER — Ambulatory Visit: Payer: Self-pay

## 2023-04-27 VITALS — BP 179/84 | HR 74 | Ht 60.0 in | Wt 98.4 lb

## 2023-04-27 DIAGNOSIS — I10 Essential (primary) hypertension: Secondary | ICD-10-CM | POA: Diagnosis not present

## 2023-04-27 DIAGNOSIS — F439 Reaction to severe stress, unspecified: Secondary | ICD-10-CM | POA: Diagnosis not present

## 2023-04-27 MED ORDER — AMLODIPINE BESYLATE 5 MG PO TABS
5.0000 mg | ORAL_TABLET | Freq: Every day | ORAL | 1 refills | Status: DC
Start: 2023-04-27 — End: 2023-04-27

## 2023-04-27 MED ORDER — AMLODIPINE BESYLATE 5 MG PO TABS: 5.0000 mg | ORAL_TABLET | Freq: Every day | ORAL | 1 refills | Status: DC
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
## 2023-04-27 NOTE — Telephone Encounter (Signed)
    Chief Complaint: BP high on and off x 2 weeks. Today 186/96, headache. Symptoms: Above Frequency: 2 weeks Pertinent Negatives: Patient denies chest pain Disposition: [] ED /[] Urgent Care (no appt availability in office) / [x] Appointment(In office/virtual)/ []  [place] [facility]/ [] Home Care/ [] Refused Recommended Disposition /[] [place] [facility]/ []  Follow-up with PCP Additional Notes: cALL 911 for worsening of symptoms. Agrees with appointment.  Reason for Disposition  [1] Systolic BP  >= 200 OR Diastolic >= 120 AND [2] having NO cardiac or neurologic symptoms  Answer Assessment - Initial Assessment Questions 1. BLOOD PRESSURE: "What is the blood pressure?" "Did you take at least two measurements 5 minutes apart?"     186/96 2. ONSET: "When did you take your blood pressure?"     Today 3. HOW: "How did you take your blood pressure?" (e.g., automatic home BP monitor, visiting nurse)     Home cuff 4. HISTORY: "Do you have a history of high blood pressure?"     Yes 5. MEDICINES: "Are you taking any medicines for blood pressure?" "Have you missed any doses recently?"     Yes 6. OTHER SYMPTOMS: "Do you have any symptoms?" (e.g., blurred vision, chest pain, difficulty breathing, headache, weakness)     Headache 7. PREGNANCY: "Is there any chance you are pregnant?" "When was your last menstrual period?"     No  Protocols used: Blood Pressure - High-A-AH

## 2023-04-27 NOTE — Assessment & Plan Note (Signed)
Elevated blood pressure likely secondary to increased stress from recent lifestyle changes. Patient reports headaches and has been waking up with headaches. -Add Amlodipine 5mg  daily to current regimen of Triamterene HCTZ. -Check blood pressure in 3 weeks to reassess. -Advise patient to contact the office if blood pressure drops to 110s/60s-70s or if she experiences symptoms such as chest pain or stroke symptoms to seek emergent care(weakness/numbness on one side of the body, facial droop, unclear speech, confusion).

## 2023-05-04 DIAGNOSIS — H34831 Tributary (branch) retinal vein occlusion, right eye, with macular edema: Secondary | ICD-10-CM | POA: Diagnosis not present

## 2023-05-19 ENCOUNTER — Other Ambulatory Visit: Payer: Self-pay | Admitting: Family Medicine

## 2023-05-20 NOTE — Telephone Encounter (Signed)
Requested medication (s) are due for refill today: yes   Requested medication (s) are on the active medication list: yes   Last refill:  04/27/23 #30 1 refills   Future visit scheduled: yes tomorrow  Notes to clinic:  requesting 90 day supply. Do you want to refill for 90 days?     Requested Prescriptions  Pending Prescriptions Disp Refills   amLODipine (NORVASC) 5 MG tablet [Pharmacy Med Name: AMLODIPINE BESYLATE 5 MG TAB] 90 tablet 1    Sig: Take 1 tablet (5 mg total) by mouth daily.     Cardiovascular: Calcium Channel Blockers 2 Failed - 05/19/2023 12:39 PM      Failed - Last BP in normal range    BP Readings from Last 1 Encounters:  04/27/23 (!) 179/84         Passed - Last Heart Rate in normal range    Pulse Readings from Last 1 Encounters:  04/27/23 74         Passed - Valid encounter within last 6 months    Recent Outpatient Visits           3 weeks ago Essential hypertension   [facility], Marzella Schlein, MD   6 months ago Encounter for annual wellness visit (AWV) in Medicare patient   [facility] [place], Marzella Schlein, MD   1 year ago Essential hypertension    [facility], Marzella Schlein, MD   1 year ago Stable branch retinal vein occlusion of right eye   [facility] Jacky Kindle, FNP   1 year ago Encounter for annual wellness visit (AWV) in Medicare patient   [facility] [place], Marzella Schlein, MD       Future Appointments             Tomorrow [place], Marzella Schlein, MD [facility]

## 2023-05-21 ENCOUNTER — Ambulatory Visit: Payer: MEDICARE | Admitting: Family Medicine

## 2023-06-18 ENCOUNTER — Ambulatory Visit: Payer: MEDICARE | Admitting: Family Medicine

## 2023-06-28 ENCOUNTER — Other Ambulatory Visit: Payer: Self-pay | Admitting: Family Medicine

## 2023-06-29 DIAGNOSIS — H34831 Tributary (branch) retinal vein occlusion, right eye, with macular edema: Secondary | ICD-10-CM | POA: Diagnosis not present

## 2023-07-06 ENCOUNTER — Ambulatory Visit (INDEPENDENT_AMBULATORY_CARE_PROVIDER_SITE_OTHER): Payer: MEDICARE | Admitting: Family Medicine

## 2023-07-06 ENCOUNTER — Encounter: Payer: Self-pay | Admitting: Family Medicine

## 2023-07-06 VITALS — BP 124/82 | HR 78 | Temp 97.6°F | Ht 60.0 in | Wt 98.0 lb

## 2023-07-06 DIAGNOSIS — Z87891 Personal history of nicotine dependence: Secondary | ICD-10-CM | POA: Diagnosis not present

## 2023-07-06 DIAGNOSIS — J069 Acute upper respiratory infection, unspecified: Secondary | ICD-10-CM | POA: Diagnosis not present

## 2023-07-06 DIAGNOSIS — H348312 Tributary (branch) retinal vein occlusion, right eye, stable: Secondary | ICD-10-CM | POA: Diagnosis not present

## 2023-07-06 DIAGNOSIS — Z8673 Personal history of transient ischemic attack (TIA), and cerebral infarction without residual deficits: Secondary | ICD-10-CM

## 2023-07-06 DIAGNOSIS — I10 Essential (primary) hypertension: Secondary | ICD-10-CM | POA: Diagnosis not present

## 2023-07-06 DIAGNOSIS — E7800 Pure hypercholesterolemia, unspecified: Secondary | ICD-10-CM | POA: Diagnosis not present

## 2023-07-06 MED ORDER — AMLODIPINE BESYLATE 5 MG PO TABS: 5.0000 mg | ORAL_TABLET | Freq: Every day | ORAL | 1 refills | Status: AC | PRN

## 2023-07-06 NOTE — Assessment & Plan Note (Signed)
F/b Optho ?Getting injections ?[facility] ?Continue risk factor management as with h/o TIAs also ?

## 2023-07-06 NOTE — Assessment & Plan Note (Signed)
 Currently on baby aspirin for secondary prevention. No recent events reported. Emphasized the importance of continuing baby aspirin to prevent future TIAs. - Continue baby aspirin daily

## 2023-07-06 NOTE — Assessment & Plan Note (Signed)
 Well-controlled on current regimen. Recent episode of elevated blood pressure (200/4 mmHg) likely due to acute stress related to daughter's departure. Blood pressure normalized shortly after. Current medications include amlodipine  5 mg daily, baby aspirin  daily, and triamterene  HCTZ 37.5-25 mg daily. Discussed the importance of continuing triamterene  HCTZ daily and using amlodipine  as needed for stress-related blood pressure spikes. Emphasized stress management. - Continue triamterene  HCTZ 37.5-25 mg daily and baby aspirin  daily - Use amlodipine  5 mg as needed for stress-induced hypertension - Order kidney function and electrolytes test today

## 2023-07-06 NOTE — Assessment & Plan Note (Signed)
 Managed with lifestyle modifications. Cholesterol levels to be checked at next physical in six months. Discussed the importance of maintaining a healthy diet and the role of cholesterol in brain function. Explained that while some cholesterol is necessary, excessive levels can be harmful. - Check cholesterol levels at next physical in six months - Encourage healthy diet

## 2023-07-06 NOTE — Progress Notes (Signed)
 Established patient visit   Patient: Mariah Levy   DOB: [birth year]   83 y.o. Female  MRN: [id number] Visit Date: 07/06/2023  Today's healthcare provider: Jon Eva, MD   Chief Complaint  Patient presents with   Hypertension   Subjective    HPI HPI   Patient has had a hectic morning and states she did not take her medication until just a little while ago.    Last edited by Desanto, Elena D, CMA on 07/06/2023  1:51 PM.       Discussed the use of AI scribe software for clinical note transcription with the patient, who gave verbal consent to proceed.  History of Present Illness   The patient, an 83 year old female with a history of hypertension, hyperlipidemia, and TIA, presents with concerns about her blood pressure. She reports that her blood pressure tends to spike during periods of stress, such as when her daughter was leaving after a week-long visit. She also mentions that she recently had a cold, which was unusual for her as she hasn't had one in years. The patient describes symptoms of laryngitis, coughing, and blisters on her lip and nose. She also mentions that she has been monitoring her oxygen  levels at home, which have remained normal despite her cold. The patient is currently taking amlodipine  5mg  daily, baby aspirin  daily, and triamterene  HCTZ 37.5-25mg  daily. She reports that she has been taking the amlodipine  as needed when she feels her blood pressure is high.        Medications: Outpatient Medications Prior to Visit  Medication Sig   aspirin  EC 81 MG tablet Take 81 mg by mouth daily. Swallow whole.   MAGNESIUM PO Take 500 mg by mouth 2 (two) times daily.   Multiple Vitamins-Minerals (ICAPS AREDS 2 PO) Take by mouth daily.    OVER THE COUNTER MEDICATION daily. Moringa   OVER THE COUNTER MEDICATION Take 1 capsule by mouth daily. BPS-5   triamcinolone  ointment (KENALOG ) 0.5 % Apply 1 Application topically 2 (two) times daily.    triamterene -hydrochlorothiazide  (MAXZIDE -25) 37.5-25 MG tablet TAKE 1 TABLET BY MOUTH EVERY DAY   TURMERIC PO Take by mouth daily. With Curcumin   [DISCONTINUED] amLODipine  (NORVASC ) 5 MG tablet TAKE 1 TABLET (5 MG TOTAL) BY MOUTH DAILY.   No facility-administered medications prior to visit.    Review of Systems     Objective    BP 124/82 (BP Location: Left Arm, Patient Position: Sitting, Cuff Size: Normal)   Pulse 78   Temp 97.6 F (36.4 C) (Oral)   Ht 5' (1.524 m)   Wt 98 lb (44.5 kg)   SpO2 98%   BMI 19.14 kg/m    Physical Exam Vitals reviewed.  Constitutional:      General: [name] not in acute distress.    Appearance: Normal appearance. [name] well-developed. [name] not diaphoretic.  HENT:     Head: Normocephalic and atraumatic.  Eyes:     General: No scleral icterus.    Conjunctiva/sclera: Conjunctivae normal.  Neck:     Thyroid : No thyromegaly.  Cardiovascular:     Rate and Rhythm: Normal rate and regular rhythm.     Pulses: Normal pulses.     Heart sounds: Normal heart sounds. No murmur heard. Pulmonary:     Effort: Pulmonary effort is normal. No respiratory distress.     Breath sounds: Normal breath sounds. No wheezing, rhonchi or rales.  Musculoskeletal:     Cervical back: Neck supple.  Right lower leg: No edema.     Left lower leg: No edema.  Lymphadenopathy:     Cervical: No cervical adenopathy.  Skin:    General: Skin is warm and dry.     Findings: No rash.  Neurological:     Mental Status: [name] alert and oriented to person, place, and time. Mental status is at baseline.  Psychiatric:        Mood and Affect: Mood normal.        Behavior: Behavior normal.      No results found for any visits on 07/06/23.  Assessment & Plan     Problem List Items Addressed This Visit       Cardiovascular and Mediastinum   Essential hypertension - Primary   Well-controlled on current regimen. Recent episode of elevated blood pressure (200/4 mmHg)  likely due to acute stress related to daughter's departure. Blood pressure normalized shortly after. Current medications include amlodipine  5 mg daily, baby aspirin  daily, and triamterene  HCTZ 37.5-25 mg daily. Discussed the importance of continuing triamterene  HCTZ daily and using amlodipine  as needed for stress-related blood pressure spikes. Emphasized stress management. - Continue triamterene  HCTZ 37.5-25 mg daily and baby aspirin  daily - Use amlodipine  5 mg as needed for stress-induced hypertension - Order kidney function and electrolytes test today      Relevant Medications   amLODipine  (NORVASC ) 5 MG tablet   Other Relevant Orders   Basic Metabolic Panel (BMET)   Retinal vein occlusion of right eye   F/b Optho Getting injections [facility] Continue risk factor management as with h/o TIAs also      Relevant Medications   amLODipine  (NORVASC ) 5 MG tablet     Other   Hypercholesteremia   Managed with lifestyle modifications. Cholesterol levels to be checked at next physical in six months. Discussed the importance of maintaining a healthy diet and the role of cholesterol in brain function. Explained that while some cholesterol is necessary, excessive levels can be harmful. - Check cholesterol levels at next physical in six months - Encourage healthy diet      Relevant Medications   amLODipine  (NORVASC ) 5 MG tablet   Hx of TIA (transient ischemic attack) and stroke   Currently on baby aspirin  for secondary prevention. No recent events reported. Emphasized the importance of continuing baby aspirin  to prevent future TIAs. - Continue baby aspirin  daily      Other Visit Diagnoses       Upper respiratory tract infection, unspecified type               Upper Respiratory Infection (URI) Recent URI with symptoms of laryngitis, cough, and blisters on lip and nose. Symptoms have mostly resolved with a lingering cough. Oxygen  saturation remained normal throughout illness,  indicating the infection was likely upper respiratory rather than pneumonia. - Monitor for any recurrence of symptoms  General Health Maintenance Routine health maintenance discussed. Next physical scheduled in six months. Discussed the importance of regular kidney function and electrolyte tests given current medications. - Schedule physical in six months - Order kidney function and electrolytes test today.        Return in about 6 months (around 01/03/2024) for CPE.       Jon Eva, MD  [facility] [phone] (phone) [phone] (fax)  [facility]

## 2023-07-07 LAB — BASIC METABOLIC PANEL
BUN/Creatinine Ratio: 25 (ref 12–28)
BUN: 19 mg/dL (ref 8–27)
CO2: 27 mmol/L (ref 20–29)
Calcium: 10.2 mg/dL (ref 8.7–10.3)
Chloride: 99 mmol/L (ref 96–106)
Creatinine, Ser: 0.75 mg/dL (ref 0.57–1.00)
Glucose: 145 mg/dL — ABNORMAL HIGH (ref 70–99)
Potassium: 4.2 mmol/L (ref 3.5–5.2)
Sodium: 141 mmol/L (ref 134–144)
eGFR: 79 mL/min/{1.73_m2} (ref >59)

## 2023-08-18 DIAGNOSIS — H353131 Nonexudative age-related macular degeneration, bilateral, early dry stage: Secondary | ICD-10-CM | POA: Diagnosis not present

## 2023-08-18 DIAGNOSIS — Z961 Presence of intraocular lens: Secondary | ICD-10-CM | POA: Diagnosis not present

## 2023-08-18 DIAGNOSIS — H34831 Tributary (branch) retinal vein occlusion, right eye, with macular edema: Secondary | ICD-10-CM | POA: Diagnosis not present

## 2023-09-30 ENCOUNTER — Telehealth: Payer: Self-pay | Admitting: Family Medicine

## 2023-09-30 NOTE — Telephone Encounter (Signed)
 [facility] faxed refill request for the following medications:   triamterene-hydrochlorothiazide (MAXZIDE-25) 37.5-25 MG tablet     Please advise.

## 2023-10-01 MED ORDER — TRIAMTERENE-HCTZ 37.5-25 MG PO TABS: 1.0000 | ORAL_TABLET | Freq: Every day | ORAL | 0 refills | Status: DC

## 2023-10-06 DIAGNOSIS — H34831 Tributary (branch) retinal vein occlusion, right eye, with macular edema: Secondary | ICD-10-CM | POA: Diagnosis not present

## 2023-11-03 DIAGNOSIS — H34831 Tributary (branch) retinal vein occlusion, right eye, with macular edema: Secondary | ICD-10-CM | POA: Diagnosis not present

## 2023-12-07 DIAGNOSIS — H34831 Tributary (branch) retinal vein occlusion, right eye, with macular edema: Secondary | ICD-10-CM | POA: Diagnosis not present

## 2023-12-26 ENCOUNTER — Other Ambulatory Visit: Payer: Self-pay | Admitting: Family Medicine

## 2023-12-28 DIAGNOSIS — H34831 Tributary (branch) retinal vein occlusion, right eye, with macular edema: Secondary | ICD-10-CM | POA: Diagnosis not present

## 2023-12-28 NOTE — Telephone Encounter (Signed)
 Requested Prescriptions  Pending Prescriptions Disp Refills   triamterene -hydrochlorothiazide (MAXZIDE-25) 37.5-25 MG tablet [Pharmacy Med Name: TRIAMTERENE -HCTZ 37.5-25 MG TB] 90 tablet 0    Sig: TAKE 1 TABLET BY MOUTH EVERY DAY     Cardiovascular: Diuretic Combos Failed - 12/28/2023  3:17 PM      Failed - Valid encounter within last 6 months    Recent Outpatient Visits   None     Future Appointments             In 1 month Bacigalupo, Jon HERO, MD [facility]            Passed - K in normal range and within 180 days    Potassium  Date Value Ref Range Status  07/06/2023 4.2 3.5 - 5.2 mmol/L Final         Passed - Na in normal range and within 180 days    Sodium  Date Value Ref Range Status  07/06/2023 141 134 - 144 mmol/L Final         Passed - Cr in normal range and within 180 days    Creatinine, Ser  Date Value Ref Range Status  07/06/2023 0.75 0.57 - 1.00 mg/dL Final         Passed - Last BP in normal range    BP Readings from Last 1 Encounters:  07/06/23 124/82

## 2024-01-04 ENCOUNTER — Encounter: Payer: Self-pay | Admitting: Family Medicine

## 2024-01-15 ENCOUNTER — Other Ambulatory Visit: Payer: Self-pay

## 2024-01-15 ENCOUNTER — Ambulatory Visit
Admission: EM | Admit: 2024-01-15 | Discharge: 2024-01-15 | Disposition: A | Discharge status: Home / Self Care | Payer: MEDICARE

## 2024-01-15 ENCOUNTER — Encounter: Payer: Self-pay | Admitting: Emergency Medicine

## 2024-01-15 ENCOUNTER — Ambulatory Visit: Payer: Self-pay

## 2024-01-15 ENCOUNTER — Inpatient Hospital Stay
Admission: EM | Admit: 2024-01-15 | Discharge: 2024-01-17 | DRG: 066 | Disposition: A | Discharge status: Home / Self Care | Payer: MEDICARE | Source: Ambulatory Visit | Attending: Internal Medicine | Admitting: Internal Medicine

## 2024-01-15 ENCOUNTER — Emergency Department: Payer: MEDICARE

## 2024-01-15 DIAGNOSIS — Z823 Family history of stroke: Secondary | ICD-10-CM | POA: Diagnosis not present

## 2024-01-15 DIAGNOSIS — Z7982 Long term (current) use of aspirin: Secondary | ICD-10-CM

## 2024-01-15 DIAGNOSIS — Z885 Allergy status to narcotic agent status: Secondary | ICD-10-CM | POA: Diagnosis not present

## 2024-01-15 DIAGNOSIS — Z800 Family history of malignant neoplasm of digestive organs: Secondary | ICD-10-CM | POA: Diagnosis not present

## 2024-01-15 DIAGNOSIS — Z8673 Personal history of transient ischemic attack (TIA), and cerebral infarction without residual deficits: Secondary | ICD-10-CM | POA: Diagnosis not present

## 2024-01-15 DIAGNOSIS — I6381 Other cerebral infarction due to occlusion or stenosis of small artery: Principal | ICD-10-CM | POA: Diagnosis present

## 2024-01-15 DIAGNOSIS — I671 Cerebral aneurysm, nonruptured: Secondary | ICD-10-CM | POA: Diagnosis present

## 2024-01-15 DIAGNOSIS — I10 Essential (primary) hypertension: Secondary | ICD-10-CM | POA: Diagnosis present

## 2024-01-15 DIAGNOSIS — R200 Anesthesia of skin: Secondary | ICD-10-CM | POA: Diagnosis not present

## 2024-01-15 DIAGNOSIS — Z90710 Acquired absence of both cervix and uterus: Secondary | ICD-10-CM

## 2024-01-15 DIAGNOSIS — R29810 Facial weakness: Secondary | ICD-10-CM

## 2024-01-15 DIAGNOSIS — Z880 Allergy status to penicillin: Secondary | ICD-10-CM

## 2024-01-15 DIAGNOSIS — Z91041 Radiographic dye allergy status: Secondary | ICD-10-CM | POA: Diagnosis not present

## 2024-01-15 DIAGNOSIS — I6389 Other cerebral infarction: Secondary | ICD-10-CM | POA: Diagnosis not present

## 2024-01-15 DIAGNOSIS — Z91018 Allergy to other foods: Secondary | ICD-10-CM | POA: Diagnosis not present

## 2024-01-15 DIAGNOSIS — Z825 Family history of asthma and other chronic lower respiratory diseases: Secondary | ICD-10-CM | POA: Diagnosis not present

## 2024-01-15 DIAGNOSIS — I639 Cerebral infarction, unspecified: Secondary | ICD-10-CM | POA: Diagnosis present

## 2024-01-15 DIAGNOSIS — E7800 Pure hypercholesterolemia, unspecified: Secondary | ICD-10-CM | POA: Diagnosis present

## 2024-01-15 DIAGNOSIS — R29818 Other symptoms and signs involving the nervous system: Secondary | ICD-10-CM | POA: Diagnosis not present

## 2024-01-15 DIAGNOSIS — Z7902 Long term (current) use of antithrombotics/antiplatelets: Secondary | ICD-10-CM

## 2024-01-15 DIAGNOSIS — R29701 NIHSS score 1: Secondary | ICD-10-CM | POA: Diagnosis present

## 2024-01-15 DIAGNOSIS — Z87891 Personal history of nicotine dependence: Secondary | ICD-10-CM

## 2024-01-15 DIAGNOSIS — Z8249 Family history of ischemic heart disease and other diseases of the circulatory system: Secondary | ICD-10-CM

## 2024-01-15 DIAGNOSIS — Z8261 Family history of arthritis: Secondary | ICD-10-CM

## 2024-01-15 DIAGNOSIS — Z888 Allergy status to other drugs, medicaments and biological substances status: Secondary | ICD-10-CM

## 2024-01-15 DIAGNOSIS — Z87892 Personal history of anaphylaxis: Secondary | ICD-10-CM

## 2024-01-15 DIAGNOSIS — Z833 Family history of diabetes mellitus: Secondary | ICD-10-CM | POA: Diagnosis not present

## 2024-01-15 HISTORY — DX: Transient cerebral ischemic attack, unspecified: G45.9

## 2024-01-15 LAB — CBC
HCT: 41 % (ref 36.0–46.0)
Hemoglobin: 14.2 g/dL (ref 12.0–15.0)
MCH: 30.7 pg (ref 26.0–34.0)
MCHC: 34.6 g/dL (ref 30.0–36.0)
MCV: 88.6 fL (ref 80.0–100.0)
Platelets: 365 K/uL (ref 150–400)
RBC: 4.63 MIL/uL (ref 3.87–5.11)
RDW: 13 % (ref 11.5–15.5)
WBC: 7.4 K/uL (ref 4.0–10.5)
nRBC: 0 % (ref 0.0–0.2)

## 2024-01-15 LAB — COMPREHENSIVE METABOLIC PANEL WITH GFR
ALT: 19 U/L (ref 0–44)
AST: 33 U/L (ref 15–41)
Albumin: 4.3 g/dL (ref 3.5–5.0)
Alkaline Phosphatase: 36 U/L — ABNORMAL LOW (ref 38–126)
Anion gap: 15 (ref 5–15)
BUN: 17 mg/dL (ref 8–23)
CO2: 21 mmol/L — ABNORMAL LOW (ref 22–32)
Calcium: 9.5 mg/dL (ref 8.9–10.3)
Chloride: 100 mmol/L (ref 98–111)
Creatinine, Ser: 0.66 mg/dL (ref 0.44–1.00)
GFR, Estimated: >60 mL/min (ref >60)
Glucose, Bld: 85 mg/dL (ref 70–99)
Potassium: 3.9 mmol/L (ref 3.5–5.1)
Sodium: 136 mmol/L (ref 135–145)
Total Bilirubin: 0.9 mg/dL (ref 0.0–1.2)
Total Protein: 7.2 g/dL (ref 6.5–8.1)

## 2024-01-15 LAB — DIFFERENTIAL
Abs Immature Granulocytes: 0.02 K/uL (ref 0.00–0.07)
Basophils Absolute: 0.1 K/uL (ref 0.0–0.1)
Basophils Relative: 1 %
Eosinophils Absolute: 0.1 K/uL (ref 0.0–0.5)
Eosinophils Relative: 1 %
Immature Granulocytes: 0 %
Lymphocytes Relative: 31 %
Lymphs Abs: 2.3 K/uL (ref 0.7–4.0)
Monocytes Absolute: 0.7 K/uL (ref 0.1–1.0)
Monocytes Relative: 9 %
Neutro Abs: 4.3 K/uL (ref 1.7–7.7)
Neutrophils Relative %: 58 %

## 2024-01-15 LAB — ETHANOL: Alcohol, Ethyl (B): <15 mg/dL (ref <15)

## 2024-01-15 LAB — PROTIME-INR
INR: 1 (ref 0.8–1.2)
Prothrombin Time: 13.5 s (ref 11.4–15.2)

## 2024-01-15 LAB — HEMOGLOBIN A1C
Hgb A1c MFr Bld: 5.4 % (ref 4.8–5.6)
Mean Plasma Glucose: 108.28 mg/dL

## 2024-01-15 LAB — APTT: aPTT: 30 s (ref 24–36)

## 2024-01-15 MED ORDER — ASPIRIN 81 MG PO TBEC
81.0000 mg | DELAYED_RELEASE_TABLET | Freq: Every day | ORAL | Status: DC
  Administered 2024-01-16 – 2024-01-17 (×2): 81 mg via ORAL
  Filled 2024-01-15 (×2): qty 1

## 2024-01-15 MED ORDER — GADOBUTROL 1 MMOL/ML IV SOLN
4.0000 mL | Freq: Once | INTRAVENOUS | Status: AC | PRN
  Administered 2024-01-15: 4 mL via INTRAVENOUS

## 2024-01-15 MED ORDER — ENOXAPARIN SODIUM 30 MG/0.3ML IJ SOSY
30.0000 mg | PREFILLED_SYRINGE | INTRAMUSCULAR | Status: DC
  Administered 2024-01-15 – 2024-01-16 (×2): 30 mg via SUBCUTANEOUS
  Filled 2024-01-15 (×2): qty 0.3

## 2024-01-15 MED ORDER — SODIUM CHLORIDE 0.9 % IV SOLN: INTRAVENOUS | Status: DC

## 2024-01-15 MED ORDER — STROKE: EARLY STAGES OF RECOVERY BOOK: Freq: Once | Status: AC

## 2024-01-15 MED ORDER — ACETAMINOPHEN 160 MG/5ML PO SOLN: 650.0000 mg | ORAL | Status: DC | PRN

## 2024-01-15 MED ORDER — ACETAMINOPHEN 650 MG RE SUPP: 650.0000 mg | RECTAL | Status: DC | PRN

## 2024-01-15 MED ORDER — ASPIRIN 81 MG PO CHEW
324.0000 mg | CHEWABLE_TABLET | Freq: Once | ORAL | Status: AC
  Administered 2024-01-15: 324 mg via ORAL
  Filled 2024-01-15: qty 4

## 2024-01-15 MED ORDER — ACETAMINOPHEN 325 MG PO TABS: 650.0000 mg | ORAL_TABLET | ORAL | Status: DC | PRN

## 2024-01-15 MED ORDER — SENNOSIDES-DOCUSATE SODIUM 8.6-50 MG PO TABS: 1.0000 | ORAL_TABLET | Freq: Every evening | ORAL | Status: DC | PRN

## 2024-01-15 NOTE — Telephone Encounter (Signed)
 FYI, called and spoke to the pt, she has confirmed she is at the ED as we speak

## 2024-01-15 NOTE — ED Triage Notes (Signed)
 Patient states numbness to left side of face for 2 days; denies slurred speech and headaches. Patient ambulating with no difficulty.

## 2024-01-15 NOTE — ED Notes (Signed)
 Patient is being discharged from the Urgent Care and sent to the Emergency Department via POV . Per Shelba Pizza, NP, patient is in need of higher level of care due to rule out stroke. Patient is aware and verbalizes understanding of plan of care.  Vitals:   01/15/24 1042 01/15/24 1046  BP:  139/80  Pulse:    Resp:    Temp:    SpO2: 99%

## 2024-01-15 NOTE — H&P (Signed)
 History and Physical    Patient: Mariah Levy FMW:[id number] DOB: 02-13-1941 DOA: 01/15/2024 DOS: the patient was seen and examined on 01/15/2024 PCP: Myrla Jon HERO, MD  Patient coming from: Home  Chief Complaint:  Chief Complaint  Patient presents with   Numbness   HPI: Mariah Levy is a 83 y.o. female with medical history significant of hypertension, and TIA's. The patient states that on Wednesday evening she noticed something different about her left cheek. She thought that maybe she had bitten it. The following day she noticed that her speech was different when she was speaking on the phone with a friend. She noticed that her left upper lip was not moving correctly. She presented to [facility] ED on 01/15/2024 with these symptoms at  10:45 AM. MRI brain was performed and was demonstrated an acute infarct of the right frontal sub-cortical white matter without acute hemorrhage or significant mass effect. There was a back ground of moderate chronic small vessel disease with scattered chronic micro hemorrhages in a predominantly subcortical distribution that is compatible with a hypertensive microangiopathy.  The patient denies confusion, word finding difficulty, neurological deficits involving her upper or lower extremities. She denied headache.   She was given an aspirin  in the ED. MRA head and neck were completed that demonstrated no large vessel occlusion. It did demonstrate a 4mm aneurysm arising from the right MCA M1 segment at the branch pointo f the right anterior temporal branch.   The patient will be admitted to a telemetry bed. Neurology has been consulted. She will be evaluated by PT/OT and SLP. She will be continued on ASA and Plavix . Her antihypertensives will be held for now, but will likelyu be restarted tomorrow as the patient presented at least 36 hours after symptoms began. Review of Systems: As mentioned in the history of present illness. All other systems reviewed and are  negative. Past Medical History:  Diagnosis Date   Hypertension    TIA (transient ischemic attack)    Past Surgical History:  Procedure Laterality Date   ABDOMINAL HYSTERECTOMY  2006   APPENDECTOMY     BREAST SURGERY Right 1985   lumpectomy x 2   BREAST SURGERY Right 1996   milk duct removal   DILATION AND CURETTAGE OF UTERUS     EYE SURGERY Bilateral    cataract/ glaucoma   HEMORROIDECTOMY     Social History:  reports that she quit smoking about 64 years ago. Her smoking use included cigarettes. She started smoking about 66 years ago. She has a 2 pack-year smoking history. She has never used smokeless tobacco. She reports that she does not currently use alcohol. She reports that she does not use drugs.  Allergies  Allergen Reactions   Atacand  [Candesartan] Shortness Of Breath   Iodine Shortness Of Breath   Other Anaphylaxis    Melons of any kind.   Demerol [Meperidine] Hives   Propoxyphene Hives    Darvon, Darvocet   Ramipril Swelling    Paresthesias.   Penicillins Rash    Family History  Problem Relation Age of Onset   Hypertension Mother    Arthritis Mother    Stroke Mother    Cancer Father        unknown origin   Aneurysm Father    Hypertension Brother    Diabetes Brother    Coronary artery disease Brother    Heart attack Brother    COPD Brother    Cancer Brother  lung   Early death Brother        Died as infant   Pneumonia Brother    Diabetes Son    Diabetes Maternal Grandmother    Colon cancer Maternal Grandfather    Breast cancer Neg Hx     Prior to Admission medications   Medication Sig Start Date End Date Taking? Authorizing Provider  amLODipine  (NORVASC ) 5 MG tablet Take 1 tablet (5 mg total) by mouth daily as needed (high BP/stress). 07/06/23  Yes Bacigalupo, Jon HERO, MD  MAGNESIUM PO Take 500 mg by mouth 2 (two) times daily. 09/15/16  Yes [provider]  Multiple Vitamins-Minerals (ICAPS AREDS 2 PO) Take by mouth daily.    Yes  [provider]  OVER THE COUNTER MEDICATION daily. Moringa   Yes [provider]  OVER THE COUNTER MEDICATION Take 1 capsule by mouth daily. BPS-5   Yes [provider]  triamterene -hydrochlorothiazide  (MAXZIDE -25) 37.5-25 MG tablet TAKE 1 TABLET BY MOUTH EVERY DAY 12/28/23  Yes Bacigalupo, Angela M, MD  TURMERIC PO Take by mouth daily. With Curcumin   Yes [provider]  aspirin  EC 81 MG tablet Take 81 mg by mouth daily. Swallow whole. Patient not taking: Reported on 01/15/2024    [provider]  triamcinolone  ointment (KENALOG ) 0.5 % Apply 1 Application topically 2 (two) times daily. 04/04/22   Myrla Jon HERO, MD    Physical Exam: Vitals:   01/15/24 1300 01/15/24 1619 01/15/24 1830 01/15/24 2000  BP: (!) 164/82  (!) 167/88 (!) 162/100  Pulse: 71  (!) 30 77  Resp: (!) 24  15 14   Temp:  98 F (36.7 C)    TempSrc:  Oral    SpO2: 100%  99% 96%  Weight:      Height:       Exam:  Constitutional:  The patient is awake, alert, and oriented x 3. No acute distress. Eyes:  pupils and irises appear normal Normal lids and conjunctivae ENMT:  grossly normal hearing  Lips appear normal external ears, nose appear normal Oropharynx: mucosa, tongue,posterior pharynx appear normal Neck:  neck appears normal, no masses, normal ROM, supple no thyromegaly Respiratory:  No increased work of breathing. No wheezes, rales, or rhonchi No tactile fremitus Cardiovascular:  Regular rate and rhythm No murmurs, ectopy, or gallups. No lateral PMI. No thrills. Abdomen:  Abdomen is soft, non-tender, non-distended No hernias, masses, or organomegaly Normoactive bowel sounds.  Musculoskeletal:  No cyanosis, clubbing, or edema Skin:  No rashes, lesions, ulcers palpation of skin: no induration or nodules Neurologic:  The patient has notable droop to the left side of her face. Otherwise cranial nerves appear intact. 5/5 muscle strength in upper  and lower extremities. Proprioception intact. Sensation all 4 extremities intact Psychiatric:  Mental status Mood, affect appropriate Orientation to person, place, time  judgment and insight appear intact  Data Reviewed:  MRI Brain MRA head and neck CBC BMP  Assessment and Plan: Hypercholesteremia As per prior lipid panel in 10/2022 the patient has hyperlipidemia. [name] not on a statin at home. Will start lipitor  20 at this time and get a lipid panel in the am.  Stroke [facility]) MRI brain demonstrates an acute infarct of the right frontal subvortical white matter with moderate chronic small vessel disease and chronic scattered micro-hemorrhages. Neurology has been consulted. Echocardiogram has been ordered as well as PT/OT/SLP. She has received ASA 325 mg. She will be continued on plavix  75 mg daily and asa 81  mg daily. Due to an elevated cholesterol in 10/2022, the patient has been started on atorvastatin  20 mg daily. Lipid panel has been ordered for the morning. Her antihypertensives have been held for now, but may be restarted tomorrow as the patient is already 36 hours out from onset of symptoms.   Essential hypertension The patient takes Amlodipine  5 mg daily at home and triamterene -hydrochlorothiazide  37.5/25 daily. These will be held for now, but may be restarted on 01/16/2024 as the onset of the patient's symptoms was 36 hours ago.   Advance Care Planning:   Code Status: Full Code   Consults: Neurology  Family Communication: I discussed the patient with her son as requested over the phone. All questions answered to the best of my ability.  Severity of Illness: The appropriate patient status for this patient is INPATIENT. Inpatient status is judged to be reasonable and necessary in order to provide the required intensity of service to ensure the patient's safety. The patient's presenting symptoms, physical exam findings, and initial radiographic and laboratory data in the context of  their chronic comorbidities is felt to place them at high risk for further clinical deterioration. Furthermore, it is not anticipated that the patient will be medically stable for discharge from the hospital within 2 midnights of admission.   * I certify that at the point of admission it is my clinical judgment that the patient will require inpatient hospital care spanning beyond 2 midnights from the point of admission due to high intensity of service, high risk for further deterioration and high frequency of surveillance required.*  Author: Reeve Mallo, DO 01/15/2024 8:16 PM  For on call review [email or web address].

## 2024-01-15 NOTE — ED Provider Notes (Signed)
 Patient presents for evaluation of a droop to the left corner of the mouth, sensation of swelling and numbness that began 2 days ago.  Wanting to be checked to see if she had bitten the inside of the mouth as she endorses that she falls asleep chewing gum and has done so in the past.  Denies mouth pain.  On exam there is a slight droop to the left corner of the mouth, no further neurological abnormality, no abnormality to the inside of the mouth indicating possible injury, also discussed that facial droop would not be a symptom of a bite, vital signs are stable, it has been recommended that she go to the nearest emergency department for ED evaluation, history of TIA and ischemic stroke, patient hesitant to go to the emergency department, asked for her PCP office to be notified, obliged and they also recommended emergency department evaluation today therefore patient willing to go and will escort self   Teresa Shelba SAUNDERS, NP 01/15/24 1114

## 2024-01-15 NOTE — ED Notes (Signed)
 Low fall risk. Pt lives independently and walks with a steady gate.

## 2024-01-15 NOTE — Discharge Instructions (Addendum)
Go to the nearest emergency department

## 2024-01-15 NOTE — ED Provider Notes (Addendum)
 [facility] Provider Note    Event Date/Time   First MD Initiated Contact with Patient 01/15/24 1202     (approximate)   History   Chief Complaint Numbness   HPI  Mariah Levy is a 83 y.o. female with past medical history of hypertension, hyperlipidemia, and TIA who presents to the ED complaining of numbness.  Patient reports that yesterday morning she began to notice that her left upper lip felt like it was drooping, making it more difficult for her to speak.  She states that felt like Novocain with decreased sensation over this portion of her lip and cheek.  She has not noticed any drooping in her upper face and denies any vision changes or speech changes.  She has not had any numbness or weakness in her extremities.  She initially presented to urgent care but was referred to the ED due to concern for stroke.     Physical Exam   Triage Vital Signs: ED Triage Vitals [01/15/24 1150]  Encounter Vitals Group     BP 124/80     Girls Systolic BP Percentile      Girls Diastolic BP Percentile      Boys Systolic BP Percentile      Boys Diastolic BP Percentile      Pulse Rate 86     Resp 18     Temp 97.9 F (36.6 C)     Temp Source Oral     SpO2 100 %     Weight 94 lb (42.6 kg)     Height 5' (1.524 m)     Head Circumference      Peak Flow      Pain Score 0     Pain Loc      Pain Education      Exclude from Growth Chart     Most recent vital signs: Vitals:   01/15/24 1209 01/15/24 1300  BP: (!) 165/75 (!) 164/82  Pulse: 76 71  Resp: 17 (!) 24  Temp:    SpO2: 100% 100%    Constitutional: Alert and oriented. Eyes: Conjunctivae are normal. Head: Atraumatic. Nose: No congestion/rhinnorhea. Mouth/Throat: Mucous membranes are moist.  Cardiovascular: Normal rate, regular rhythm. Grossly normal heart sounds.  2+ radial pulses bilaterally. Respiratory: Normal respiratory effort.  No retractions. Lungs CTAB. Gastrointestinal: Soft and  nontender. No distention. Musculoskeletal: No lower extremity tenderness nor edema.  Neurologic:  Normal speech and language.  Subtle left-sided facial droop noted with flattening of the nasolabial fold, upper face does not appear to be involved.    ED Results / Procedures / Treatments   Labs (all labs ordered are listed, but only abnormal results are displayed) Labs Reviewed  COMPREHENSIVE METABOLIC PANEL WITH GFR - Abnormal; Notable for the following components:      Result Value   CO2 21 (*)    Alkaline Phosphatase 36 (*)    All other components within normal limits  CBC  DIFFERENTIAL  ETHANOL  PROTIME-INR  APTT   Radiology: MRI brain reviewed and interpreted by me with acute stroke.  EKG  ED ECG REPORT I, Carlin Palin, the attending physician, personally viewed and interpreted this ECG.   Date: 01/15/2024  EKG Time: 11:56  Rate: 80  Rhythm: normal sinus rhythm  Axis: Normal  Intervals:none  ST&T Change: None  PROCEDURES:  Critical Care performed: No  Procedures   MEDICATIONS ORDERED IN ED: Medications  gadobutrol (GADAVIST) 1 MMOL/ML injection 4 mL (4 mLs Intravenous  Contrast Given 01/15/24 1430)     IMPRESSION / MDM / ASSESSMENT AND PLAN / ED COURSE  I reviewed the triage vital signs and the nursing notes.                              84 y.o. female with past medical history of hypertension, hyperlipidemia, and TIA who presents to the ED complaining of some drooping and numbness over the left side of her face over the past 24 hours.  Patient's presentation is most consistent with acute presentation with potential threat to life or bodily function.  Differential diagnosis includes, but is not limited to, stroke, TIA, Bell's palsy, anemia, electrolyte abnormality, AKI, hypoglycemia.  Patient nontoxic-appearing and in no acute distress, vital signs are unremarkable.  She does seem to have some subtle drooping of her left face that does not appear to  involve her upper face.  Presentation concerning for stroke but she is outside the window for TNK and exam not consistent with LVO.  We will further assess with MRI brain, labs reassuring without significant anemia, leukocytosis, electrolyte abnormality, or AKI.  LFTs and coags are also unremarkable.  MRI is concerning for acute stroke, will give loading dose of aspirin and case discussed with hospitalist for admission.      FINAL CLINICAL IMPRESSION(S) / ED DIAGNOSES   Final diagnoses:  Facial droop     Rx / DC Orders   ED Discharge Orders     None        Note:  This document was prepared using Dragon voice recognition software and may include unintentional dictation errors.   Willo Dunnings, MD 01/15/24 1527    Willo Dunnings, MD 01/15/24 [phone]

## 2024-01-15 NOTE — Telephone Encounter (Signed)
 FYI Only or Action Required?: FYI only for provider.  Patient was last seen in primary care on 07/06/2023 by Myrla Jon HERO, MD.  Called Nurse Triage reporting Neurologic Problem.  Symptoms began 2 days ago.  Interventions attempted: Nothing.  Symptoms are: unchanged.  Triage Disposition: Go to ED Now (or PCP Triage)  Patient/caregiver understands and will follow disposition?: Yes  Copied from CRM [id number]. Topic: Clinical - Red Word Triage >> Jan 15, 2024 11:00 AM Chasity T wrote: [facility] that prompted transfer to Nurse Triage: Shelba from urgent [facility] of [place] is currently with patient and is telling her she needs to go to the ER for a stroke but patient does not believe her and wanted her to call the office for us  to tell er she needs to go. Reason for Disposition  Patient sounds very sick or weak to the triager  Answer Assessment - Initial Assessment Questions 1. SYMPTOM: What is the main symptom you are concerned about? (e.g., weakness, numbness)     Left side facial droop 2. ONSET: When did this start? (e.g., minutes, hours, days; while sleeping)     2 days ago 3. LAST NORMAL: When was the last time you (the patient) were normal (no symptoms)?     2 days ago 4. PATTERN Does this come and go, or has it been constant since it started?  Is it present now?     constant 5. CARDIAC SYMPTOMS: Have you had any of the following symptoms: chest pain, difficulty breathing, palpitations?     no 6. NEUROLOGIC SYMPTOMS: Have you had any of the following symptoms: headache, dizziness, vision loss, double vision, changes in speech, unsteady on your feet?     Changes in speech 7. OTHER SYMPTOMS: Do you have any other symptoms?     no  Patient at [facility] calling due to patient not believing that she needed to be evaluated for possible stroke-patient wanted to speak to office nurse triage. This RN explained to patient that the  recommendation would be for her to be evaluated in the ED. Patient verbalized understanding and said she would follow through.  Protocols used: Neurologic Deficit-A-AH

## 2024-01-15 NOTE — ED Triage Notes (Signed)
 Patient reports numbness to left side of face x 2 days. Patient feels like her smile is uneven. Patient denies pain.

## 2024-01-16 ENCOUNTER — Inpatient Hospital Stay
Admit: 2024-01-16 | Discharge: 2024-01-16 | Disposition: A | Discharge status: Home / Self Care | Payer: MEDICARE | Attending: Internal Medicine | Admitting: Internal Medicine

## 2024-01-16 DIAGNOSIS — I639 Cerebral infarction, unspecified: Secondary | ICD-10-CM | POA: Diagnosis not present

## 2024-01-16 DIAGNOSIS — R29701 NIHSS score 1: Secondary | ICD-10-CM | POA: Diagnosis not present

## 2024-01-16 LAB — LIPID PANEL
Cholesterol: 272 mg/dL — ABNORMAL HIGH (ref 0–200)
HDL: 92 mg/dL (ref >40)
LDL Cholesterol: 170 mg/dL — ABNORMAL HIGH (ref 0–99)
Total CHOL/HDL Ratio: 3 ratio
Triglycerides: 50 mg/dL (ref <150)
VLDL: 10 mg/dL (ref 0–40)

## 2024-01-16 MED ORDER — TRIAMTERENE-HCTZ 37.5-25 MG PO TABS
1.0000 | ORAL_TABLET | Freq: Every day | ORAL | Status: DC
  Administered 2024-01-17: 1 via ORAL
  Filled 2024-01-16: qty 1

## 2024-01-16 MED ORDER — ATORVASTATIN CALCIUM 80 MG PO TABS
80.0000 mg | ORAL_TABLET | Freq: Every day | ORAL | Status: DC
  Administered 2024-01-17: 80 mg via ORAL
  Filled 2024-01-16: qty 1

## 2024-01-16 MED ORDER — ATORVASTATIN CALCIUM 20 MG PO TABS: 40.0000 mg | ORAL_TABLET | Freq: Every day | ORAL | Status: DC

## 2024-01-16 MED ORDER — ATORVASTATIN CALCIUM 20 MG PO TABS
20.0000 mg | ORAL_TABLET | Freq: Every day | ORAL | Status: DC
  Administered 2024-01-16: 20 mg via ORAL
  Filled 2024-01-16: qty 1

## 2024-01-16 MED ORDER — CLOPIDOGREL BISULFATE 75 MG PO TABS
225.0000 mg | ORAL_TABLET | Freq: Once | ORAL | Status: AC
  Administered 2024-01-16: 225 mg via ORAL
  Filled 2024-01-16: qty 3

## 2024-01-16 MED ORDER — CLOPIDOGREL BISULFATE 75 MG PO TABS
75.0000 mg | ORAL_TABLET | Freq: Every day | ORAL | Status: DC
  Administered 2024-01-16 – 2024-01-17 (×2): 75 mg via ORAL
  Filled 2024-01-16 (×2): qty 1

## 2024-01-16 MED ORDER — HYDROXYZINE HCL 10 MG PO TABS
10.0000 mg | ORAL_TABLET | Freq: Once | ORAL | Status: AC
  Administered 2024-01-16: 10 mg via ORAL
  Filled 2024-01-16: qty 1

## 2024-01-16 NOTE — Consult Note (Signed)
 NEUROLOGY CONSULT NOTE   Date of service: January 16, 2024 Patient Name: Mariah Levy MRN:  [id number] DOB:  04-14-1941 Chief Complaint: Facial weakness Requesting Provider: Jens Durand, MD  History of Present Illness  Mariah Levy is a 83 y.o. female with hx of hypertension who presents with facial weakness that started on Wednesday evening.  She states that she noticed that he just felt off.  It has been a persistent problem since that time and therefore she sought care in the emergency department last night which reveals a subcortical stroke on the right and therefore neurology has been consulted.  She denies any involvement of her arms or legs, denies any visual change, or other symptoms.  LKW: Wednesday evening Modified rankin score: 0-Completely asymptomatic and back to baseline post- stroke IV Thrombolysis: out of window EVT: no, out of window  NIHSS components Score: Comment  1a Level of Conscious 0[]  1[]  2[]  3[]      1b LOC Questions 0[]  1[]  2[]       1c LOC Commands 0[]  1[]  2[]       2 Best Gaze 0[]  1[]  2[]       3 Visual 0[]  1[]  2[]  3[]      4 Facial Palsy 0[]  1[x]  2[]  3[]      5a Motor Arm - left 0[]  1[]  2[]  3[]  4[]  UN[]    5b Motor Arm - Right 0[]  1[]  2[]  3[]  4[]  UN[]    6a Motor Leg - Left 0[]  1[]  2[]  3[]  4[]  UN[]    6b Motor Leg - Right 0[]  1[]  2[]  3[]  4[]  UN[]    7 Limb Ataxia 0[]  1[]  2[]  UN[]      8 Sensory 0[]  1[]  2[]  UN[]      9 Best Language 0[]  1[]  2[]  3[]      10 Dysarthria 0[]  1[]  2[]  UN[]      11 Extinct. and Inattention 0[]  1[]  2[]       TOTAL: 1    Past History   Past Medical History:  Diagnosis Date   Hypertension    TIA (transient ischemic attack)     Past Surgical History:  Procedure Laterality Date   ABDOMINAL HYSTERECTOMY  2006   APPENDECTOMY     BREAST SURGERY Right 1985   lumpectomy x 2   BREAST SURGERY Right 1996   milk duct removal   DILATION AND CURETTAGE OF UTERUS     EYE SURGERY Bilateral    cataract/ glaucoma   HEMORROIDECTOMY       Family History: Family History  Problem Relation Age of Onset   Hypertension Mother    Arthritis Mother    Stroke Mother    Cancer Father        unknown origin   Aneurysm Father    Hypertension Brother    Diabetes Brother    Coronary artery disease Brother    Heart attack Brother    COPD Brother    Cancer Brother        lung   Early death Brother        Died as infant   Pneumonia Brother    Diabetes Son    Diabetes Maternal Grandmother    Colon cancer Maternal Grandfather    Breast cancer Neg Hx     Social History  reports that she quit smoking about 64 years ago. Her smoking use included cigarettes. She started smoking about 66 years ago. She has a 2 pack-year smoking history. She has never used smokeless tobacco. She reports that she does not currently use alcohol. She  reports that she does not use drugs.  Allergies  Allergen Reactions   Atacand  [Candesartan] Shortness Of Breath   Iodine Shortness Of Breath   Other Anaphylaxis    Melons of any kind.   Demerol [Meperidine] Hives   Propoxyphene Hives    Darvon, Darvocet   Ramipril Swelling    Paresthesias.   Penicillins Rash    Medications   Current Facility-Administered Medications:    acetaminophen  (TYLENOL ) tablet 650 mg, 650 mg, Oral, Q4H PRN **OR** acetaminophen  (TYLENOL ) 160 MG/5ML solution 650 mg, 650 mg, Per Tube, Q4H PRN **OR** acetaminophen  (TYLENOL ) suppository 650 mg, 650 mg, Rectal, Q4H PRN, Swayze, Ava, DO   aspirin  EC tablet 81 mg, 81 mg, Oral, Daily, Swayze, Ava, DO, 81 mg at 01/16/24 0918   atorvastatin  (LIPITOR ) tablet 20 mg, 20 mg, Oral, Daily, Swayze, Ava, DO, 20 mg at 01/16/24 9081   clopidogrel  (PLAVIX ) tablet 75 mg, 75 mg, Oral, Daily, Swayze, Ava, DO, 75 mg at 01/16/24 0918   enoxaparin  (LOVENOX ) injection 30 mg, 30 mg, Subcutaneous, Q24H, Swayze, Ava, DO, 30 mg at 2024-01-23 2222   senna-docusate (Senokot-S) tablet 1 tablet, 1 tablet, Oral, QHS PRN, Swayze, Ava, DO  Vitals   Vitals:    01/16/24 0006 01/16/24 0415 01/16/24 0804 01/16/24 1139  BP: (!) 153/75 130/67 116/69 137/68  Pulse: 68 79 75 87  Resp: 18 18 17 13   Temp: 97.7 F (36.5 C) 98 F (36.7 C) 97.6 F (36.4 C) (!) 97.4 F (36.3 C)  TempSrc:  Oral Oral Oral  SpO2: 98% 95% 99% 99%  Weight:      Height:        Body mass index is 18.13 kg/m.   Physical Exam   Constitutional: Appears well-developed and well-nourished.   Neurologic Examination    Neuro: Mental Status: Patient is awake, alert, oriented to person, place, month, year, and situation. Patient is able to give a clear and coherent history. No signs of aphasia or neglect Cranial Nerves: II: Visual Fields are full. Pupils are equal, round, and reactive to light.   III,IV, VI: EOMI without ptosis or diploplia.  V: Facial sensation is symmetric to temperature VII: Facial movement is symmetric.  VIII: hearing is intact to voice X: Uvula elevates symmetrically XII: tongue is midline without atrophy or fasciculations.  Motor: Tone is normal. Bulk is normal. 5/5 strength was present in bilateral legs in the right arm, though she does not drift in the left arm she has a very mild orbital sign and to confrontation I think she has very mild weakness. Sensory: Sensation is symmetric to light touch and temperature in the arms and legs. Cerebellar: FNF intact bilaterally        Labs/Imaging/Neurodiagnostic studies   CBC:  Recent Labs  Lab January 23, 2024 1153  WBC 7.4  NEUTROABS 4.3  HGB 14.2  HCT 41.0  MCV 88.6  PLT 365   Basic Metabolic Panel:  Lab Results  Component Value Date   NA 136 2024-01-23   K 3.9 01-23-2024   CO2 21 (L) 2024/01/23   GLUCOSE 85 01-23-2024   BUN 17 01/23/2024   CREATININE 0.66 01-23-24   CALCIUM  9.5 2024-01-23   GFRNONAA >60 23-Jan-2024   GFRAA 88 08/02/2020   Lipid Panel:  Lab Results  Component Value Date   LDLCALC 170 (H) 01/16/2024   HgbA1c:  Lab Results  Component Value Date   HGBA1C  5.4 01/23/24    Alcohol Level     Component Value Date/Time  ETH <15 01/15/2024 1153   INR  Lab Results  Component Value Date   INR 1.0 01/15/2024   APTT  Lab Results  Component Value Date   APTT 30 01/15/2024    MRA head and neck-incidental 4 mm aneurysm  MRI Brain(Personally reviewed): Subcortical stroke on the right   ASSESSMENT   SYNDEY JASKOLSKI is a 83 y.o. female with a subcortical infarct on the right.  She will need optimization of her secondary risk factors including aggressive lipid control.  I would favor   RECOMMENDATIONS  Atorvastatin  80 mg daily Echocardiogram and telemetry Aspirin  and Plavix  for 3 weeks followed by aspirin  81 mg daily BP control with goal normotension. PT, OT already signed off. I will send a message to Dr. Rosslyn of [facility] neurosurgery to try to arrange follow-up for aneurysm. ______________________________________________________________________    Bonney Aisha Seals, MD [facility]

## 2024-01-16 NOTE — Assessment & Plan Note (Signed)
 As per prior lipid panel in 10/2022 the patient has hyperlipidemia. [name] not on a statin at home. Will start lipitor  20 at this time and get a lipid panel in the am.

## 2024-01-16 NOTE — Progress Notes (Signed)
   01/16/24 0900  Spiritual Encounters  Type of Visit Initial  Care provided to: Patient  Referral source Patient request  Reason for visit Advance directives  OnCall Visit Yes  Spiritual Framework  Patient Stress Factors Family relationships;Health changes  Interventions  Spiritual Care Interventions Made Narrative/life review;Prayer  Intervention Outcomes  Outcomes Reduced anxiety   Chaplain responded to [place] consult to provide AD packet. Information given and explained. Patient also shared life and family concerns that were causing some anxiety and through conversation and life review patient felt more at peace. Chaplain services will check back on progress of AD.

## 2024-01-16 NOTE — Progress Notes (Signed)
 Progress Note    Mariah Levy  FMW:[id number] DOB: Jan 04, 1941  DOA: 01/15/2024 PCP: Myrla Jon HERO, MD      Brief Narrative:    Medical records reviewed and are as summarized below:  Mariah Levy is a 83 y.o. female with medical history significant for hypertension, hypercholesterolemia, TIA, who presented to the hospital with numbness on the left side of her face.  Numbness is mainly on her left cheek and the left side of her lips.  She noticed that she had trouble opening her mouth when she spoke.  She noticed that symptoms started on the evening of 01/13/2024.  She presented to the emergency department on 01/15/2024 around 10:40 AM.   She was found to have acute stroke (infarct in the posterior right frontal subcortical white matter and overlying the right precentral gyrus.    Assessment/Plan:   Principal Problem:   Stroke [facility]) Active Problems:   Hypercholesteremia   Essential hypertension   Body mass index is 18.13 kg/m.   Acute stroke/infarct in the posterior right frontal subcortical white matter: Left facial numbness has improved.  Continue Lipitor , aspirin  and Plavix .  Increase Lipitor  from 20 mg to 40 mg daily. No LVO on MRA head and neck. EKG and telemetry showed normal sinus rhythm.   2D echo is pending. She has been evaluated by PT, OT and ST.  No skilled therapy required.   4 mm aneurysm arising from the right MCA M1 segment: Outpatient follow-up with neurosurgeon for close monitoring.   Hyperlipidemia: Increase Lipitor  from 20 mg to 40 mg daily. Lipid panel total cholesterol 272, triglycerides 50, HDL 92, LDL 170.   Hypertension: Resume HCTZ tomorrow.     Diet Order             Diet Heart Fluid consistency: Thin; Fluid restriction: 1200 mL Fluid  Diet effective now                            Consultants: Neurologist  Procedures: None    Medications:    aspirin  EC  81 mg Oral Daily   atorvastatin   20 mg Oral  Daily   clopidogrel   225 mg Oral Once   clopidogrel   75 mg Oral Daily   enoxaparin  (LOVENOX ) injection  30 mg Subcutaneous Q24H   Continuous Infusions:   Anti-infectives (From admission, onward)    None              Family Communication/Anticipated D/C date and plan/Code Status   DVT prophylaxis: enoxaparin  (LOVENOX ) injection 30 mg Start: 01/15/24 2200     Code Status: Full Code  Family Communication: None Disposition Plan: Plan to discharge home   Status is: Inpatient Remains inpatient appropriate because: Acute stroke       Subjective:   Interval events noted.  Numbness on the left cheek has improved but she still feels some numbness on the left upper lip.  No other complaints.  No unilateral weakness or or numbness in the extremities.  No change in speech or vision.  Objective:    Vitals:   01/16/24 0006 01/16/24 0415 01/16/24 0804 01/16/24 1139  BP: (!) 153/75 130/67 116/69 137/68  Pulse: 68 79 75 87  Resp: 18 18 17 13   Temp: 97.7 F (36.5 C) 98 F (36.7 C) 97.6 F (36.4 C) (!) 97.4 F (36.3 C)  TempSrc:  Oral Oral Oral  SpO2: 98% 95% 99% 99%  Weight:  Height:       No data found.   Intake/Output Summary (Last 24 hours) at 01/16/2024 1404 Last data filed at 01/16/2024 0900 Gross per 24 hour  Intake 474.8 ml  Output --  Net 474.8 ml   Filed Weights   01/15/24 1150 01/15/24 2200  Weight: 42.6 kg 42.1 kg    Exam:  GEN: NAD SKIN: Warm and dry EYES: No pallor or icterus ENT: MMM CV: RRR PULM: CTA B ABD: soft, ND, NT, +BS CNS: AAO x 3, non focal EXT: No edema or tenderness        Data Reviewed:   I have personally reviewed following labs and imaging studies:  Labs: Labs show the following:   Basic Metabolic Panel: Recent Labs  Lab 01/15/24 1153  NA 136  K 3.9  CL 100  CO2 21*  GLUCOSE 85  BUN 17  CREATININE 0.66  CALCIUM  9.5   GFR Estimated Creatinine Clearance: 35.4 mL/min (by C-G formula based on  SCr of 0.66 mg/dL). Liver Function Tests: Recent Labs  Lab 01/15/24 1153  AST 33  ALT 19  ALKPHOS 36*  BILITOT 0.9  PROT 7.2  ALBUMIN 4.3   No results for input(s): LIPASE, AMYLASE in the last 168 hours. No results for input(s): AMMONIA in the last 168 hours. Coagulation profile Recent Labs  Lab 01/15/24 1215  INR 1.0    CBC: Recent Labs  Lab 01/15/24 1153  WBC 7.4  NEUTROABS 4.3  HGB 14.2  HCT 41.0  MCV 88.6  PLT 365   Cardiac Enzymes: No results for input(s): CKTOTAL, CKMB, CKMBINDEX, TROPONINI in the last 168 hours. BNP (last 3 results) No results for input(s): PROBNP in the last 8760 hours. CBG: No results for input(s): GLUCAP in the last 168 hours. D-Dimer: No results for input(s): DDIMER in the last 72 hours. Hgb A1c: Recent Labs    01/15/24 1153  HGBA1C 5.4   Lipid Profile: Recent Labs    01/16/24 0405  CHOL 272*  HDL 92  LDLCALC 170*  TRIG 50  CHOLHDL 3.0   Thyroid  function studies: No results for input(s): TSH, T4TOTAL, T3FREE, THYROIDAB in the last 72 hours.  Invalid input(s): FREET3 Anemia work up: No results for input(s): VITAMINB12, FOLATE, FERRITIN, TIBC, IRON, RETICCTPCT in the last 72 hours. Sepsis Labs: Recent Labs  Lab 01/15/24 1153  WBC 7.4    Microbiology No results found for this or any previous visit (from the past 240 hours).  Procedures and diagnostic studies:  MR BRAIN WO CONTRAST Result Date: 01/15/2024 EXAM: MRI BRAIN WITHOUT CONTRAST MRA HEAD WITHOUT CONTRAST MRA NECK WITH AND WITHOUT CONTRAST 01/15/2024 02:44:04 PM TECHNIQUE: Multiplanar multisequence MRI of the brain was performed without the administration of intravenous contrast. MRA of the head was performed without contrast using time-of-flight technique. MRA of the neck was performed with and without contrast. 3D postprocessing with multiplanar reformations and MIPs was performed for better evaluation of the  vasculature. Stenosis of the cervical internal carotid arteries is measured using NASCET criteria. COMPARISON: None available. CLINICAL HISTORY: Neuro deficit, acute, stroke suspected. Patient states numbness to left side of face for 2 days; denies slurred speech and headaches. Patient ambulating with no difficulty. FINDINGS: MRI BRAIN: BRAIN AND VENTRICLES: Acute infarct in the posterior right frontal subcortical white matter underlying the right precentral gyrus. No acute hemorrhage or significant mass effect. Background of moderate chronic small vessel disease. Scattered chronic micro hemorrhages in a predominantly subcortical distribution. ORBITS: No acute abnormality. SINUSES AND  MASTOIDS: No acute abnormality. BONES AND SOFT TISSUES: Normal bone marrow signal. No acute soft tissue abnormality. MRA HEAD: ANTERIOR CIRCULATION: 4 mm aneurysm arising from the right MCA M1 segment at the branch point of the right anterior temporal branch seen on coronal image 194 series 1076. No large vessel occlusion or significant stenosis of the bilateral MCAs. No significant stenosis of the anterior cerebral arteries. No significant stenosis of the intracranial internal carotid arteries. POSTERIOR CIRCULATION: No significant stenosis of the posterior cerebral arteries. No significant stenosis of the basilar artery. No significant stenosis of the vertebral arteries. No aneurysm. MRA NECK: CERVICAL CAROTID ARTERIES: No dissection. No hemodynamically significant stenosis by NASCET criteria. CERVICAL VERTEBRAL ARTERIES: No dissection. No significant stenosis. IMPRESSION: 1. Acute infarct in the posterior right frontal subcortical white matter underlying the right precentral gyrus. No acute hemorrhage or significant mass effect. 2. No large vessel occlusion or significant stenosis of the head or neck vessels. 3. A 4 mm aneurysm arising from the right MCA M1 segment at the branch point of the right anterior temporal branch. 4.  Moderate chronic small vessel disease with scattered chronic microhemorrhages in a predominantly subcortical distribution, compatible with hypertensive microangiopathy. Electronically signed by: Ryan Chess MD 01/15/2024 03:32 PM EDT RP Workstation: [id number]   MR ANGIO HEAD WO CONTRAST Result Date: 01/15/2024 EXAM: MRI BRAIN WITHOUT CONTRAST MRA HEAD WITHOUT CONTRAST MRA NECK WITH AND WITHOUT CONTRAST 01/15/2024 02:44:04 PM TECHNIQUE: Multiplanar multisequence MRI of the brain was performed without the administration of intravenous contrast. MRA of the head was performed without contrast using time-of-flight technique. MRA of the neck was performed with and without contrast. 3D postprocessing with multiplanar reformations and MIPs was performed for better evaluation of the vasculature. Stenosis of the cervical internal carotid arteries is measured using NASCET criteria. COMPARISON: None available. CLINICAL HISTORY: Neuro deficit, acute, stroke suspected. Patient states numbness to left side of face for 2 days; denies slurred speech and headaches. Patient ambulating with no difficulty. FINDINGS: MRI BRAIN: BRAIN AND VENTRICLES: Acute infarct in the posterior right frontal subcortical white matter underlying the right precentral gyrus. No acute hemorrhage or significant mass effect. Background of moderate chronic small vessel disease. Scattered chronic micro hemorrhages in a predominantly subcortical distribution. ORBITS: No acute abnormality. SINUSES AND MASTOIDS: No acute abnormality. BONES AND SOFT TISSUES: Normal bone marrow signal. No acute soft tissue abnormality. MRA HEAD: ANTERIOR CIRCULATION: 4 mm aneurysm arising from the right MCA M1 segment at the branch point of the right anterior temporal branch seen on coronal image 194 series 1076. No large vessel occlusion or significant stenosis of the bilateral MCAs. No significant stenosis of the anterior cerebral arteries. No significant stenosis of the  intracranial internal carotid arteries. POSTERIOR CIRCULATION: No significant stenosis of the posterior cerebral arteries. No significant stenosis of the basilar artery. No significant stenosis of the vertebral arteries. No aneurysm. MRA NECK: CERVICAL CAROTID ARTERIES: No dissection. No hemodynamically significant stenosis by NASCET criteria. CERVICAL VERTEBRAL ARTERIES: No dissection. No significant stenosis. IMPRESSION: 1. Acute infarct in the posterior right frontal subcortical white matter underlying the right precentral gyrus. No acute hemorrhage or significant mass effect. 2. No large vessel occlusion or significant stenosis of the head or neck vessels. 3. A 4 mm aneurysm arising from the right MCA M1 segment at the branch point of the right anterior temporal branch. 4. Moderate chronic small vessel disease with scattered chronic microhemorrhages in a predominantly subcortical distribution, compatible with hypertensive microangiopathy. Electronically signed by: Ryan Chess MD 01/15/2024  03:32 PM EDT RP Workstation: [id number]   MR ANGIO NECK W WO CONTRAST Result Date: 01/15/2024 EXAM: MRI BRAIN WITHOUT CONTRAST MRA HEAD WITHOUT CONTRAST MRA NECK WITH AND WITHOUT CONTRAST 01/15/2024 02:44:04 PM TECHNIQUE: Multiplanar multisequence MRI of the brain was performed without the administration of intravenous contrast. MRA of the head was performed without contrast using time-of-flight technique. MRA of the neck was performed with and without contrast. 3D postprocessing with multiplanar reformations and MIPs was performed for better evaluation of the vasculature. Stenosis of the cervical internal carotid arteries is measured using NASCET criteria. COMPARISON: None available. CLINICAL HISTORY: Neuro deficit, acute, stroke suspected. Patient states numbness to left side of face for 2 days; denies slurred speech and headaches. Patient ambulating with no difficulty. FINDINGS: MRI BRAIN: BRAIN AND VENTRICLES: Acute  infarct in the posterior right frontal subcortical white matter underlying the right precentral gyrus. No acute hemorrhage or significant mass effect. Background of moderate chronic small vessel disease. Scattered chronic micro hemorrhages in a predominantly subcortical distribution. ORBITS: No acute abnormality. SINUSES AND MASTOIDS: No acute abnormality. BONES AND SOFT TISSUES: Normal bone marrow signal. No acute soft tissue abnormality. MRA HEAD: ANTERIOR CIRCULATION: 4 mm aneurysm arising from the right MCA M1 segment at the branch point of the right anterior temporal branch seen on coronal image 194 series 1076. No large vessel occlusion or significant stenosis of the bilateral MCAs. No significant stenosis of the anterior cerebral arteries. No significant stenosis of the intracranial internal carotid arteries. POSTERIOR CIRCULATION: No significant stenosis of the posterior cerebral arteries. No significant stenosis of the basilar artery. No significant stenosis of the vertebral arteries. No aneurysm. MRA NECK: CERVICAL CAROTID ARTERIES: No dissection. No hemodynamically significant stenosis by NASCET criteria. CERVICAL VERTEBRAL ARTERIES: No dissection. No significant stenosis. IMPRESSION: 1. Acute infarct in the posterior right frontal subcortical white matter underlying the right precentral gyrus. No acute hemorrhage or significant mass effect. 2. No large vessel occlusion or significant stenosis of the head or neck vessels. 3. A 4 mm aneurysm arising from the right MCA M1 segment at the branch point of the right anterior temporal branch. 4. Moderate chronic small vessel disease with scattered chronic microhemorrhages in a predominantly subcortical distribution, compatible with hypertensive microangiopathy. Electronically signed by: Ryan Chess MD 01/15/2024 03:32 PM EDT RP Workstation: [id number]               LOS: 1 day   Vila Dory  [facility]   Pager on [email or web address]. If  7PM-7AM, please contact night-coverage at [email or web address]     01/16/2024, 2:04 PM

## 2024-01-16 NOTE — Progress Notes (Signed)
*  PRELIMINARY RESULTS* Echocardiogram 2D Echocardiogram has been performed.  Bari BROCKS Brien Lowe 01/16/2024, 4:03 PM

## 2024-01-16 NOTE — Assessment & Plan Note (Signed)
 The patient takes Amlodipine  5 mg daily at home and triamterene -hydrochlorothiazide  37.5/25 daily. These will be held for now, but may be restarted on 01/16/2024 as the onset of the patient's symptoms was 36 hours ago.

## 2024-01-16 NOTE — Evaluation (Signed)
 Speech Language Pathology Evaluation Patient Details Name: Mariah Levy MRN: [id number] DOB: 1940/09/01 Today's Date: 01/16/2024 Time: 9178-9157 SLP Time Calculation (min) (ACUTE ONLY): 21 min  Problem List:  Patient Active Problem List   Diagnosis Date Noted   Stroke (HCC) 01/15/2024   Irritant contact dermatitis due to detergent 04/04/2022   Hx of TIA (transient ischemic attack) and stroke 01/23/2022   Senile asthenia 01/23/2022   Retinal vein occlusion of right eye 10/03/2021   Patellofemoral dysfunction, left 08/02/2020   Constipation 04/02/2017   Ocular migraine 07/02/2016   Hypercholesteremia 01/15/2015   Essential hypertension 01/15/2015   Polyp of nasal sinus 01/15/2015   Apnea, sleep 01/15/2015   Past Medical History:  Past Medical History:  Diagnosis Date   Hypertension    TIA (transient ischemic attack)    Past Surgical History:  Past Surgical History:  Procedure Laterality Date   ABDOMINAL HYSTERECTOMY  2006   APPENDECTOMY     BREAST SURGERY Right 1985   lumpectomy x 2   BREAST SURGERY Right 1996   milk duct removal   DILATION AND CURETTAGE OF UTERUS     EYE SURGERY Bilateral    cataract/ glaucoma   HEMORROIDECTOMY     HPI:  Per H&P, Mariah Levy is a 83 y.o. female with medical history significant of hypertension, and TIA's. The patient states that on Wednesday evening she noticed something different about her left cheek. She thought that maybe she had bitten it. The following day she noticed that her speech was different when she was speaking on the phone with a friend. She noticed that her left upper lip was not moving correctly. She presented to [facility] ED on 01/15/2024 with these symptoms at  10:45 AM.  MRI brain was performed and was demonstrated an acute infarct of the right frontal sub-cortical white matter without acute hemorrhage or significant mass effect. There was a back ground of moderate chronic small vessel disease with scattered chronic micro  hemorrhages in a predominantly subcortical distribution that is compatible with a hypertensive microangiopathy.     The patient denies confusion, word finding difficulty, neurological deficits involving her upper or lower extremities. She denied headache.   Assessment / Plan / Recommendation Clinical Impression  Pt seen for cognitive linguistic evaluation in the setting of acute CVA (right frontal sub-cortical infarct). Assessment consisting of pt interview and completion of dynamic assessment. Independent orientation, safety awareness, functional recall, expressive/receptive language, and visual processing demonstrated during selecting from menu, identifying course of events leading to admission, and verbalizing current safety restrictions. Motor speech notable minimally reduced articulation precision- related to slight left labial weakness/sensation deficit- though speech is 100% intelligible. Pt reporting gradual improvement in facial sensation in the last 24 hours, and expects continued recovery. Education shared regarding application of slow rate and over articulation to aid pt's perception of speech. Pt reporting understanding and demonstrated application. Suspect anxiety related to hospital admission impacting processing intermittently, verbal reassurance provided. Pt with independent awareness for current deficits and application of compensatory strategies. No further acute services indicated. Education shared with pt regarding contacting PCP/hospitalist if pt desires follow up SLP services at time of discharge.     SLP Assessment  SLP Recommendation/Assessment: Patient does not need any further Speech Language Pathology Services SLP Visit Diagnosis: Dysarthria and anarthria (R47.1)     Assistance Recommended at Discharge  Set up Supervision/Assistance initially at time of discharge  Functional Status Assessment Patient has had a recent decline in their functional status and  demonstrates the  ability to make significant improvements in function in a reasonable and predictable amount of time.        SLP Evaluation Cognition  Overall Cognitive Status: Within Functional Limits for tasks assessed Arousal/Alertness: Awake/alert Orientation Level: Oriented X4 Attention: Focused;Sustained Focused Attention: Appears intact Sustained Attention: Appears intact Memory: Appears intact (functional memory for events leading up to admission intact) Awareness: Appears intact Problem Solving: Appears intact Behaviors:  (none) Safety/Judgment: Appears intact       Comprehension  Auditory Comprehension Overall Auditory Comprehension: Appears within functional limits for tasks assessed Visual Recognition/Discrimination Discrimination: Within Function Limits Reading Comprehension Reading Status: Within funtional limits    Expression Expression Primary Mode of Expression: Verbal Verbal Expression Overall Verbal Expression: Appears within functional limits for tasks assessed Written Expression Written Expression: Not tested   Oral / Motor  Oral Motor/Sensory Function Overall Oral Motor/Sensory Function: Mild impairment Facial ROM: Reduced left Facial Symmetry: Abnormal symmetry left (min) Facial Strength: Within Functional Limits (min) Facial Sensation: Within Functional Limits (specidically lip) Lingual ROM: Within Functional Limits Lingual Symmetry: Within Functional Limits Lingual Strength: Within Functional Limits Lingual Sensation: Within Functional Limits Velum: Within Functional Limits Mandible: Within Functional Limits Motor Speech Overall Motor Speech: Impaired Respiration: Within functional limits Phonation: Normal Resonance: Within functional limits Articulation: Impaired Level of Impairment: Conversation Intelligibility: Intelligible Motor Planning: Within functional limits Motor Speech Errors: Aware Interfering Components:  (none) Effective Techniques: Slow  rate;Over-articulate           Mariah Danyal Adorno Clapp, MS, CCC-SLP Speech Language Pathologist Rehab Services; [facility] [phone] (ascom)   Mariah J Levy 01/16/2024, 9:45 AM

## 2024-01-16 NOTE — Evaluation (Addendum)
 Occupational Therapy Evaluation Patient Details Name: Mariah Levy MRN: [id number] DOB: 01/23/1941 Today's Date: 01/16/2024   History of Present Illness   Pt is a 83 y.o. female presented to [facility] ED on 01/15/2024 with L facial differences. MRI brain was performed and demonstrated an acute infarct of the right frontal sub-cortical white matter without acute hemorrhage or significant mass effect. PMH of hypertension, and TIA's.     Clinical Impressions Pt was seen for OT evaluation this date. PTA, pt resides alone in a one level apartment with level entry with her dog. [name] IND at baseline without AD use, care for her dog, drives, etc. Reports her neighbor is able to take care of her dog while she is hospitalized.  Pt presents with no deficits in strength, balance, safety, ADL performance or functional mobility. She demo all tasks with MOD I/IND this date with ability to get OOB, ambulating within the room, perform UB/LB dressing tasks and ambulate ~80 ft in the hallway with no AD use and no LOB with steady pace. She feels she is moving at her baseline. She has no further acute OT needs and will sign off in house with no follow up OT needed.       If plan is discharge home, recommend the following:         Functional Status Assessment   Patient has not had a recent decline in their functional status     Equipment Recommendations   None recommended by OT     Recommendations for Other Services         Precautions/Restrictions   Precautions Precautions: None Restrictions Weight Bearing Restrictions Per Provider Order: No     Mobility Bed Mobility Overal bed mobility: Modified Independent, Independent                  Transfers Overall transfer level: Independent Equipment used: None               General transfer comment: no assist for STS from EOB or to ambulate ~80 ft without AD, no LOB noted and good pace      Balance Overall balance  assessment: Modified Independent                                         ADL either performed or assessed with clinical judgement   ADL Overall ADL's : Modified independent;Independent                                       General ADL Comments: able to don gown, socks and perform self feeding with independence     Vision         Perception         Praxis         Pertinent Vitals/Pain Pain Assessment Pain Assessment: No/denies pain     Extremity/Trunk Assessment Upper Extremity Assessment Upper Extremity Assessment: Overall WFL for tasks assessed   Lower Extremity Assessment Lower Extremity Assessment: Overall WFL for tasks assessed       Communication Communication Communication: No apparent difficulties   Cognition Arousal: Alert Behavior During Therapy: WFL for tasks assessed/performed, Anxious Cognition: No apparent impairments  Following commands: Intact       Cueing  General Comments          Exercises     Shoulder Instructions      Home Living Family/patient expects to be discharged to:: Private residence Living Arrangements: Alone Available Help at Discharge: Neighbor;Available PRN/intermittently Type of Home: Apartment Home Access: Level entry     Home Layout: One level     Bathroom Shower/Tub: Chief Strategy Officer: Standard     Home Equipment: Grab bars - tub/shower      Lives With:  (independent)    Prior Functioning/Environment Prior Level of Function : Independent/Modified Independent;Driving                    OT Problem List:     OT Treatment/Interventions:        OT Goals(Current goals can be found in the care plan section)       OT Frequency:       Co-evaluation              AM-PAC OT 6 Clicks Daily Activity     Outcome Measure Help from another person eating meals?: None Help from another person  taking care of personal grooming?: None Help from another person toileting, which includes using toliet, bedpan, or urinal?: None Help from another person bathing (including washing, rinsing, drying)?: None Help from another person to put on and taking off regular upper body clothing?: None Help from another person to put on and taking off regular lower body clothing?: None 6 Click Score: 24   End of Session Nurse Communication: Mobility status  Activity Tolerance: Patient tolerated treatment well Patient left: in chair;with call bell/phone within reach  OT Visit Diagnosis: Other abnormalities of gait and mobility (R26.89)                Time: 9156-9096 OT Time Calculation (min): 20 min Charges:  OT General Charges $OT Visit: 1 Visit OT Evaluation $OT Eval Low Complexity: 1 Low  Sallie Maker, OTR/L 01/16/24, 11:35 AM  [name] 01/16/2024, 11:33 AM

## 2024-01-16 NOTE — Care Management Important Message (Signed)
 Important Message  Patient Details  Name: MARLEA GAMBILL MRN: [id number] Date of Birth: [birth year]   Important Message Given:  Yes - Medicare IM     Rojelio SHAUNNA Rattler 01/16/2024, 2:15 PM

## 2024-01-16 NOTE — Assessment & Plan Note (Signed)
 MRI brain demonstrates an acute infarct of the right frontal subvortical white matter with moderate chronic small vessel disease and chronic scattered micro-hemorrhages. Neurology has been consulted. Echocardiogram has been ordered as well as PT/OT/SLP. She has received ASA 325 mg. She will be continued on plavix  75 mg daily and asa 81 mg daily. Due to an elevated cholesterol in 10/2022, the patient has been started on atorvastatin  20 mg daily. Lipid panel has been ordered for the morning. Her antihypertensives have been held for now, but may be restarted tomorrow as the patient is already 36 hours out from onset of symptoms.

## 2024-01-16 NOTE — Evaluation (Addendum)
 Physical Therapy Evaluation Patient Details Name: Mariah Levy MRN: [id number] DOB: 12/01/1940 Today's Date: 01/16/2024  History of Present Illness  Pt is a 83 y.o. female presented to [facility] ED on 01/15/2024 with L facial differences. MRI brain was performed and demonstrated an acute infarct of the right frontal sub-cortical white matter without acute hemorrhage or significant mass effect. PMH of hypertension, and TIA's.  Clinical Impression  Pt received seated in recliner upon arrival to room and pt agreeable to therapy.  Pt with good strength, testing 4+/5 globally throughout bilateral LE's.  Pt able to perform transfers and mobility without any physical assistance and has good balance throughout.  Pt able to stand with single leg stance >10 sec on each LE.  Pt then ambulated in the hallways without any physical assistance and with great speed.  No LOB noted and pt able to safely return to her room.  Pt's main concern is the numbness/lidocaine feeling she has on the L corner of her mouth.  Other than that, pt is doing well and is safe to discharge from physical therapy standpoint.  Patient is at baseline, all education completed, and time is given to address all questions/concerns. No additional skilled PT services needed at this time, PT signing off. PT recommends daily ambulation ad lib or with nursing staff as needed to prevent deconditioning.          If plan is discharge home, recommend the following: Other (comment) (No assistance needed.)   Can travel by private vehicle        Equipment Recommendations None recommended by PT  Recommendations for Other Services       Functional Status Assessment Patient has not had a recent decline in their functional status     Precautions / Restrictions Precautions Precautions: None Restrictions Weight Bearing Restrictions Per Provider Order: No      Mobility  Bed Mobility Overal bed mobility: Modified Independent, Independent                   Transfers Overall transfer level: Independent Equipment used: None               General transfer comment: safe transfer into standing from recliner    Ambulation/Gait Ambulation/Gait assistance: Supervision Gait Distance (Feet): 200 Feet Assistive device: None Gait Pattern/deviations: WFL(Within Functional Limits)       General Gait Details: pt with fast gait, and normal ambulation without any LOB.  Pt even able to demonstrate single leg balance >10 sec each LE.  Stairs            Wheelchair Mobility     Tilt Bed    Modified Rankin (Stroke Patients Only)       Balance Overall balance assessment: Modified Independent                                           Pertinent Vitals/Pain Pain Assessment Pain Assessment: No/denies pain    Home Living Family/patient expects to be discharged to:: Private residence Living Arrangements: Alone Available Help at Discharge: Neighbor;Available PRN/intermittently Type of Home: Apartment Home Access: Level entry       Home Layout: One level Home Equipment: Grab bars - tub/shower      Prior Function Prior Level of Function : Independent/Modified Independent;Driving  Extremity/Trunk Assessment   Upper Extremity Assessment Upper Extremity Assessment: Overall WFL for tasks assessed    Lower Extremity Assessment Lower Extremity Assessment: Overall WFL for tasks assessed       Communication   Communication Communication: No apparent difficulties    Cognition Arousal: Alert Behavior During Therapy: WFL for tasks assessed/performed, Anxious                             Following commands: Intact       Cueing       General Comments      Exercises     Assessment/Plan    PT Assessment Patient does not need any further PT services  PT Problem List         PT Treatment Interventions      PT Goals (Current goals can be  found in the Care Plan section)  Acute Rehab PT Goals Patient Stated Goal: return home. PT Goal Formulation: With patient Time For Goal Achievement: 01/30/24 Potential to Achieve Goals: Good    Frequency       Co-evaluation               AM-PAC PT 6 Clicks Mobility  Outcome Measure Help needed turning from your back to your side while in a flat bed without using bedrails?: None Help needed moving from lying on your back to sitting on the side of a flat bed without using bedrails?: None Help needed moving to and from a bed to a chair (including a wheelchair)?: None Help needed standing up from a chair using your arms (e.g., wheelchair or bedside chair)?: None Help needed to walk in hospital room?: None Help needed climbing 3-5 steps with a railing? : None 6 Click Score: 24    End of Session Equipment Utilized During Treatment: Gait belt Activity Tolerance: Patient tolerated treatment well Patient left: in chair Nurse Communication: Mobility status PT Visit Diagnosis: Unsteadiness on feet (R26.81)    Time: 8963-8951 PT Time Calculation (min) (ACUTE ONLY): 12 min   Charges:   PT Evaluation $PT Eval Low Complexity: 1 Low   PT General Charges $$ ACUTE PT VISIT: 1 Visit         Fonda Simpers, PT, DPT Physical Therapist - [facility]  [facility]  01/16/24, 11:45 AM

## 2024-01-17 DIAGNOSIS — I639 Cerebral infarction, unspecified: Secondary | ICD-10-CM | POA: Diagnosis not present

## 2024-01-17 LAB — ECHOCARDIOGRAM COMPLETE
AR max vel: 2.41 cm2
AV Area VTI: 2.15 cm2
AV Area mean vel: 2.14 cm2
AV Mean grad: 5 mmHg
AV Peak grad: 8.5 mmHg
Ao pk vel: 1.46 m/s
Area-P 1/2: 6.32 cm2
Height: 60 in
MV VTI: 2.81 cm2
S' Lateral: 2.3 cm
Weight: 1485.02 [oz_av]

## 2024-01-17 MED ORDER — CLOPIDOGREL BISULFATE 75 MG PO TABS: 75.0000 mg | ORAL_TABLET | Freq: Every day | ORAL | 0 refills | Status: DC

## 2024-01-17 MED ORDER — ATORVASTATIN CALCIUM 80 MG PO TABS: 80.0000 mg | ORAL_TABLET | Freq: Every day | ORAL | 0 refills | Status: DC

## 2024-01-17 NOTE — Discharge Summary (Signed)
 Physician Discharge Summary   Patient: Mariah Levy MRN: [id number] DOB: 1941/01/29  Admit date:     01/15/2024  Discharge date: 01/17/24  Discharge Physician: AIDA CHO   PCP: Myrla Jon HERO, MD   Recommendations at discharge:   Follow-up with PCP in 1 week Follow-up with Dr. Dino Sable, neurosurgeon, in [place] for evaluation of 4 mm brain aneurysm. Outpatient follow-up with neurologist within 1 month of discharge  Discharge Diagnoses: Principal Problem:   Stroke [facility]) Active Problems:   Hypercholesteremia   Essential hypertension  Resolved Problems:   * No resolved hospital problems. [facility] Course:  Mariah Levy is a 83 y.o. female with medical history significant for hypertension, hypercholesterolemia, TIA, who presented to the hospital with numbness on the left side of her face.  Numbness is mainly on her left cheek and the left side of her lips.  She noticed that she had trouble opening her mouth when she spoke.  She noticed that symptoms started on the evening of 01/13/2024.  She presented to the emergency department on 01/15/2024 around 10:40 AM.     She was found to have acute stroke (infarct in the posterior right frontal subcortical white matter and overlying the right precentral gyrus.      Assessment and Plan:   Acute stroke/infarct in the posterior right frontal subcortical white matter: Left facial numbness has improved.  Continue dual antiplatelet therapy with aspirin  and Plavix  for 21 days followed by aspirin  monotherapy.  Increase Lipitor  from 40 mg to 80 mg daily.   No LVO on MRA head and neck. EKG and telemetry showed normal sinus rhythm.   2D echo showed preserved ejection fraction, grade 1 diastolic dysfunction and there was no evidence of interatrial shunt. She has been evaluated by PT, OT and ST.  No skilled therapy required.     4 mm aneurysm arising from the right MCA M1 segment: Outpatient follow-up with Dr. Dino Sable,  neurosurgeon, for further management.       Hyperlipidemia: Continue Lipitor  80 mg daily Lipid panel total cholesterol 272, triglycerides 50, HDL 92, LDL 170.     Hypertension: Resume home antihypertensives   Her condition has improved and she is deemed stable for discharge to home today.       Consultants: Neurologist Procedures performed: None Disposition: Home Diet recommendation:  Discharge Diet Orders (From admission, onward)     Start     Ordered   01/17/24 0000  Diet - low sodium heart healthy        01/17/24 1126           Cardiac diet DISCHARGE MEDICATION: Allergies as of 01/17/2024       Reactions   Atacand  [candesartan] Shortness Of Breath   Iodine Shortness Of Breath   Other Anaphylaxis   Melons of any kind.   Demerol [meperidine] Hives   Propoxyphene Hives   Darvon, Darvocet   Ramipril Swelling   Paresthesias.   Penicillins Rash        Medication List     STOP taking these medications    OVER THE COUNTER MEDICATION   OVER THE COUNTER MEDICATION       TAKE these medications    amLODipine  5 MG tablet Commonly known as: NORVASC  Take 1 tablet (5 mg total) by mouth daily as needed (high BP/stress).   aspirin  EC 81 MG tablet Take 81 mg by mouth daily. Swallow whole.   atorvastatin  80 MG tablet Commonly known as: LIPITOR   Take 1 tablet (80 mg total) by mouth daily. Start taking on: January 18, 2024   clopidogrel  75 MG tablet Commonly known as: PLAVIX  Take 1 tablet (75 mg total) by mouth daily. Start taking on: January 18, 2024   ICAPS AREDS 2 PO Take by mouth daily.   MAGNESIUM PO Take 500 mg by mouth 2 (two) times daily.   triamcinolone  ointment 0.5 % Commonly known as: KENALOG  Apply 1 Application topically 2 (two) times daily.   triamterene -hydrochlorothiazide  37.5-25 MG tablet Commonly known as: MAXZIDE -25 TAKE 1 TABLET BY MOUTH EVERY DAY   TURMERIC PO Take by mouth daily. With Curcumin        Follow-up Information      [facility] at [facility].   Specialty: Emergency Medicine Why: If symptoms worsen Contact information: [address] [place]  [address] [phone]        Schedule an appointment as soon as possible for a visit  with Myrla Jon HERO, MD.   Specialty: Family Medicine Contact information: [address] [place] [address] [place] [address] [phone]                Discharge Exam: Mariah Levy   01/15/24 1150 01/15/24 2200  Weight: 42.6 kg 42.1 kg   GEN: NAD SKIN: Warm and dry EYES: No pallor or icterus ENT: MMM CV: RRR PULM: CTA B ABD: soft, ND, NT, +BS CNS: AAO x 3, non focal EXT: No edema or tenderness   Condition at discharge: good  The results of significant diagnostics from this hospitalization (including imaging, microbiology, ancillary and laboratory) are listed below for reference.   Imaging Studies: ECHOCARDIOGRAM COMPLETE Result Date: 01/17/2024    ECHOCARDIOGRAM REPORT   Patient Name:   Mariah Levy [facility] Date of Exam: 01/16/2024 Medical Rec #:  [id number]     Height:       60.0 in Accession #:    [id number]    Weight:       92.8 lb Date of Birth:  10/28/1940     BSA:          1.347 m Patient Age:    82 years      BP:           116/69 mmHg Patient Gender: F             HR:           79 bpm. Exam Location:  [facility] Procedure: 2D Echo, Cardiac Doppler and Color Doppler (Both Spectral and Color            Flow Doppler were utilized during procedure). Indications:     Stroke I63.9  History:         Patient has no prior history of Echocardiogram examinations.                  TIA.  Sonographer:     Bari Roar Referring Phys:  [id number] Diagnosing Phys: Cara JONETTA Lovelace MD IMPRESSIONS  1. Left ventricular ejection fraction, by estimation, is 60 to 65%. The left ventricle has normal function. The left ventricle has no regional wall motion abnormalities. Left ventricular diastolic parameters are consistent  with Grade I diastolic dysfunction (impaired relaxation).  2. Right ventricular systolic function is normal. The right ventricular size is normal.  3. The mitral valve is normal in structure. Trivial mitral valve regurgitation.  4. The aortic valve is normal in structure. Aortic valve regurgitation is not visualized. FINDINGS  Left Ventricle: Left ventricular ejection fraction, by estimation, is 60 to 65%. The left ventricle has normal function. The left ventricle has no regional wall motion abnormalities. Strain was performed and the global longitudinal strain is indeterminate. The left ventricular internal cavity size was normal in size. There is no left ventricular hypertrophy. Left ventricular diastolic parameters are consistent with Grade I diastolic dysfunction (impaired relaxation). Right Ventricle: The right ventricular size is normal. No increase in right ventricular wall thickness. Right ventricular systolic function is normal. Left Atrium: Left atrial size was normal in size. Right Atrium: Right atrial size was normal in size. Pericardium: There is no evidence of pericardial effusion. Mitral Valve: The mitral valve is normal in structure. Trivial mitral valve regurgitation. MV peak gradient, 5.0 mmHg. The mean mitral valve gradient is 3.0 mmHg. Tricuspid Valve: The tricuspid valve is normal in structure. Tricuspid valve regurgitation is trivial. Aortic Valve: The aortic valve is normal in structure. Aortic valve regurgitation is not visualized. Aortic valve mean gradient measures 5.0 mmHg. Aortic valve peak gradient measures 8.5 mmHg. Aortic valve area, by VTI measures 2.15 cm. Pulmonic Valve: The pulmonic valve was normal in structure. Pulmonic valve regurgitation is not visualized. Aorta: The ascending aorta was not well visualized. IAS/Shunts: No atrial level shunt detected by color flow Doppler. Additional Comments: 3D was performed not requiring image post processing on an independent workstation  and was indeterminate.  LEFT VENTRICLE PLAX 2D LVIDd:         3.40 cm   Diastology LVIDs:         2.30 cm   LV e' medial:    8.05 cm/s LV PW:         0.90 cm   LV E/e' medial:  9.5 LV IVS:        0.80 cm   LV e' lateral:   12.60 cm/s LVOT diam:     1.80 cm   LV E/e' lateral: 6.1 LV SV:         63 LV SV Index:   47 LVOT Area:     2.54 cm  RIGHT VENTRICLE RV Basal diam:  2.80 cm RV Mid diam:    2.50 cm RV S prime:     15.70 cm/s TAPSE (M-mode): 2.8 cm LEFT ATRIUM             Index        RIGHT ATRIUM           Index LA diam:        3.00 cm 2.23 cm/m   RA Area:     13.20 cm LA Vol (A2C):   28.0 ml 20.79 ml/m  RA Volume:   28.10 ml  20.86 ml/m LA Vol (A4C):   26.2 ml 19.45 ml/m LA Biplane Vol: 28.1 ml 20.86 ml/m  AORTIC VALVE                     PULMONIC VALVE AV Area (Vmax):    2.41 cm      PV Vmax:          1.25 m/s AV Area (Vmean):   2.14 cm      PV Peak grad:     6.2 mmHg AV Area (VTI):     2.15 cm      PR End Diast Vel: 5.48 msec AV Vmax:           146.00 cm/s AV Vmean:          105.000  cm/s AV VTI:            0.292 m AV Peak Grad:      8.5 mmHg AV Mean Grad:      5.0 mmHg LVOT Vmax:         138.00 cm/s LVOT Vmean:        88.400 cm/s LVOT VTI:          0.247 m LVOT/AV VTI ratio: 0.85  AORTA Ao Root diam: 2.30 cm Ao Asc diam:  2.90 cm MITRAL VALVE                TRICUSPID VALVE MV Area (PHT): 6.32 cm     TR Peak grad:   31.6 mmHg MV Area VTI:   2.81 cm     TR Vmax:        281.00 cm/s MV Peak grad:  5.0 mmHg MV Mean grad:  3.0 mmHg     SHUNTS MV Vmax:       1.12 m/s     Systemic VTI:  0.25 m MV Vmean:      76.2 cm/s    Systemic Diam: 1.80 cm MV Decel Time: 120 msec MV E velocity: 76.50 cm/s MV A velocity: 104.00 cm/s MV E/A ratio:  0.74 MV A Prime:    17.1 cm/s Dwayne D Callwood MD Electronically signed by Cara JONETTA Lovelace MD Signature Date/Time: 01/17/2024/9:26:49 AM    Final    MR BRAIN WO CONTRAST Result Date: 01/15/2024 EXAM: MRI BRAIN WITHOUT CONTRAST MRA HEAD WITHOUT CONTRAST MRA NECK WITH AND  WITHOUT CONTRAST 01/15/2024 02:44:04 PM TECHNIQUE: Multiplanar multisequence MRI of the brain was performed without the administration of intravenous contrast. MRA of the head was performed without contrast using time-of-flight technique. MRA of the neck was performed with and without contrast. 3D postprocessing with multiplanar reformations and MIPs was performed for better evaluation of the vasculature. Stenosis of the cervical internal carotid arteries is measured using NASCET criteria. COMPARISON: None available. CLINICAL HISTORY: Neuro deficit, acute, stroke suspected. Patient states numbness to left side of face for 2 days; denies slurred speech and headaches. Patient ambulating with no difficulty. FINDINGS: MRI BRAIN: BRAIN AND VENTRICLES: Acute infarct in the posterior right frontal subcortical white matter underlying the right precentral gyrus. No acute hemorrhage or significant mass effect. Background of moderate chronic small vessel disease. Scattered chronic micro hemorrhages in a predominantly subcortical distribution. ORBITS: No acute abnormality. SINUSES AND MASTOIDS: No acute abnormality. BONES AND SOFT TISSUES: Normal bone marrow signal. No acute soft tissue abnormality. MRA HEAD: ANTERIOR CIRCULATION: 4 mm aneurysm arising from the right MCA M1 segment at the branch point of the right anterior temporal branch seen on coronal image 194 series 1076. No large vessel occlusion or significant stenosis of the bilateral MCAs. No significant stenosis of the anterior cerebral arteries. No significant stenosis of the intracranial internal carotid arteries. POSTERIOR CIRCULATION: No significant stenosis of the posterior cerebral arteries. No significant stenosis of the basilar artery. No significant stenosis of the vertebral arteries. No aneurysm. MRA NECK: CERVICAL CAROTID ARTERIES: No dissection. No hemodynamically significant stenosis by NASCET criteria. CERVICAL VERTEBRAL ARTERIES: No dissection. No  significant stenosis. IMPRESSION: 1. Acute infarct in the posterior right frontal subcortical white matter underlying the right precentral gyrus. No acute hemorrhage or significant mass effect. 2. No large vessel occlusion or significant stenosis of the head or neck vessels. 3. A 4 mm aneurysm arising from the right MCA M1 segment at the branch point of the right  anterior temporal branch. 4. Moderate chronic small vessel disease with scattered chronic microhemorrhages in a predominantly subcortical distribution, compatible with hypertensive microangiopathy. Electronically signed by: Ryan Chess MD 01/15/2024 03:32 PM EDT RP Workstation: [id number]   MR ANGIO HEAD WO CONTRAST Result Date: 01/15/2024 EXAM: MRI BRAIN WITHOUT CONTRAST MRA HEAD WITHOUT CONTRAST MRA NECK WITH AND WITHOUT CONTRAST 01/15/2024 02:44:04 PM TECHNIQUE: Multiplanar multisequence MRI of the brain was performed without the administration of intravenous contrast. MRA of the head was performed without contrast using time-of-flight technique. MRA of the neck was performed with and without contrast. 3D postprocessing with multiplanar reformations and MIPs was performed for better evaluation of the vasculature. Stenosis of the cervical internal carotid arteries is measured using NASCET criteria. COMPARISON: None available. CLINICAL HISTORY: Neuro deficit, acute, stroke suspected. Patient states numbness to left side of face for 2 days; denies slurred speech and headaches. Patient ambulating with no difficulty. FINDINGS: MRI BRAIN: BRAIN AND VENTRICLES: Acute infarct in the posterior right frontal subcortical white matter underlying the right precentral gyrus. No acute hemorrhage or significant mass effect. Background of moderate chronic small vessel disease. Scattered chronic micro hemorrhages in a predominantly subcortical distribution. ORBITS: No acute abnormality. SINUSES AND MASTOIDS: No acute abnormality. BONES AND SOFT TISSUES: Normal bone  marrow signal. No acute soft tissue abnormality. MRA HEAD: ANTERIOR CIRCULATION: 4 mm aneurysm arising from the right MCA M1 segment at the branch point of the right anterior temporal branch seen on coronal image 194 series 1076. No large vessel occlusion or significant stenosis of the bilateral MCAs. No significant stenosis of the anterior cerebral arteries. No significant stenosis of the intracranial internal carotid arteries. POSTERIOR CIRCULATION: No significant stenosis of the posterior cerebral arteries. No significant stenosis of the basilar artery. No significant stenosis of the vertebral arteries. No aneurysm. MRA NECK: CERVICAL CAROTID ARTERIES: No dissection. No hemodynamically significant stenosis by NASCET criteria. CERVICAL VERTEBRAL ARTERIES: No dissection. No significant stenosis. IMPRESSION: 1. Acute infarct in the posterior right frontal subcortical white matter underlying the right precentral gyrus. No acute hemorrhage or significant mass effect. 2. No large vessel occlusion or significant stenosis of the head or neck vessels. 3. A 4 mm aneurysm arising from the right MCA M1 segment at the branch point of the right anterior temporal branch. 4. Moderate chronic small vessel disease with scattered chronic microhemorrhages in a predominantly subcortical distribution, compatible with hypertensive microangiopathy. Electronically signed by: Ryan Chess MD 01/15/2024 03:32 PM EDT RP Workstation: [id number]   MR ANGIO NECK W WO CONTRAST Result Date: 01/15/2024 EXAM: MRI BRAIN WITHOUT CONTRAST MRA HEAD WITHOUT CONTRAST MRA NECK WITH AND WITHOUT CONTRAST 01/15/2024 02:44:04 PM TECHNIQUE: Multiplanar multisequence MRI of the brain was performed without the administration of intravenous contrast. MRA of the head was performed without contrast using time-of-flight technique. MRA of the neck was performed with and without contrast. 3D postprocessing with multiplanar reformations and MIPs was performed for  better evaluation of the vasculature. Stenosis of the cervical internal carotid arteries is measured using NASCET criteria. COMPARISON: None available. CLINICAL HISTORY: Neuro deficit, acute, stroke suspected. Patient states numbness to left side of face for 2 days; denies slurred speech and headaches. Patient ambulating with no difficulty. FINDINGS: MRI BRAIN: BRAIN AND VENTRICLES: Acute infarct in the posterior right frontal subcortical white matter underlying the right precentral gyrus. No acute hemorrhage or significant mass effect. Background of moderate chronic small vessel disease. Scattered chronic micro hemorrhages in a predominantly subcortical distribution. ORBITS: No acute abnormality. SINUSES AND MASTOIDS: No  acute abnormality. BONES AND SOFT TISSUES: Normal bone marrow signal. No acute soft tissue abnormality. MRA HEAD: ANTERIOR CIRCULATION: 4 mm aneurysm arising from the right MCA M1 segment at the branch point of the right anterior temporal branch seen on coronal image 194 series 1076. No large vessel occlusion or significant stenosis of the bilateral MCAs. No significant stenosis of the anterior cerebral arteries. No significant stenosis of the intracranial internal carotid arteries. POSTERIOR CIRCULATION: No significant stenosis of the posterior cerebral arteries. No significant stenosis of the basilar artery. No significant stenosis of the vertebral arteries. No aneurysm. MRA NECK: CERVICAL CAROTID ARTERIES: No dissection. No hemodynamically significant stenosis by NASCET criteria. CERVICAL VERTEBRAL ARTERIES: No dissection. No significant stenosis. IMPRESSION: 1. Acute infarct in the posterior right frontal subcortical white matter underlying the right precentral gyrus. No acute hemorrhage or significant mass effect. 2. No large vessel occlusion or significant stenosis of the head or neck vessels. 3. A 4 mm aneurysm arising from the right MCA M1 segment at the branch point of the right anterior  temporal branch. 4. Moderate chronic small vessel disease with scattered chronic microhemorrhages in a predominantly subcortical distribution, compatible with hypertensive microangiopathy. Electronically signed by: Ryan Chess MD 01/15/2024 03:32 PM EDT RP Workstation: [id number]    Microbiology: Results for orders placed or performed in visit on 01/15/15  Stool C-Diff Toxin Assay     Status: None   Collection Time: 01/16/15  9:00 AM   Specimen: Stool   ST  Result Value Ref Range Status   C difficile Toxins A+B, EIA Negative Negative Final    Labs: CBC: Recent Labs  Lab 01/15/24 1153  WBC 7.4  NEUTROABS 4.3  HGB 14.2  HCT 41.0  MCV 88.6  PLT 365   Basic Metabolic Panel: Recent Labs  Lab 01/15/24 1153  NA 136  K 3.9  CL 100  CO2 21*  GLUCOSE 85  BUN 17  CREATININE 0.66  CALCIUM  9.5   Liver Function Tests: Recent Labs  Lab 01/15/24 1153  AST 33  ALT 19  ALKPHOS 36*  BILITOT 0.9  PROT 7.2  ALBUMIN 4.3   CBG: No results for input(s): GLUCAP in the last 168 hours.  Discharge time spent: greater than 30 minutes.  Signed: AIDA CHO, MD [facility] 01/17/2024

## 2024-01-18 ENCOUNTER — Telehealth: Payer: Self-pay

## 2024-01-18 NOTE — Transitions of Care (Post Inpatient/ED Visit) (Signed)
   01/18/2024  Name: Mariah Levy MRN: [id number] DOB: July 04, 1940  Today's TOC FU Call Status: Today's TOC FU Call Status:: Successful TOC FU Call Completed TOC FU Call Complete Date: 01/18/24 Patient's Name and Date of Birth confirmed.  Transition Care Management Follow-up Telephone Call Date of Discharge: 01/17/24 Discharge Facility: [facility]) Type of Discharge: Inpatient Admission Primary Inpatient Discharge Diagnosis:: cerebral infarction How have you been since you were released from the hospital?: Better Any questions or concerns?: Yes Patient Questions/Concerns:: injection site sore Patient Questions/Concerns Addressed: Notified Provider of Patient Questions/Concerns  Items Reviewed: Did you receive and understand the discharge instructions provided?: Yes Medications obtained,verified, and reconciled?: Yes (Medications Reviewed) Any new allergies since your discharge?: No Dietary orders reviewed?: Yes Do you have support at home?: No  Medications Reviewed Today: Medications Reviewed Today   Medications were not reviewed in this encounter     Home Care and Equipment/Supplies: Were Home Health Services Ordered?: NA Any new equipment or medical supplies ordered?: NA  Functional Questionnaire: Do you need assistance with bathing/showering or dressing?: No Do you need assistance with meal preparation?: No Do you need assistance with eating?: No Do you have difficulty maintaining continence: No Do you need assistance with getting out of bed/getting out of a chair/moving?: No Do you have difficulty managing or taking your medications?: No  Follow up appointments reviewed: PCP Follow-up appointment confirmed?: Yes Date of PCP follow-up appointment?: 01/20/24 Follow-up Provider: [facility] Follow-up appointment confirmed?: Yes Date of Specialist follow-up appointment?: 01/19/24 Follow-Up Specialty Provider:: neuro Do you  need transportation to your follow-up appointment?: No Do you understand care options if your condition(s) worsen?: Yes-patient verbalized understanding    SIGNATURE Julian Lemmings, LPN [facility] Nurse Health Advisor Direct Dial [phone]

## 2024-01-19 ENCOUNTER — Encounter: Payer: Self-pay | Admitting: Neurosurgery

## 2024-01-19 ENCOUNTER — Encounter: Payer: Self-pay | Admitting: Family Medicine

## 2024-01-19 ENCOUNTER — Ambulatory Visit (INDEPENDENT_AMBULATORY_CARE_PROVIDER_SITE_OTHER): Payer: MEDICARE | Admitting: Neurosurgery

## 2024-01-19 ENCOUNTER — Ambulatory Visit: Payer: MEDICARE | Admitting: Family Medicine

## 2024-01-19 VITALS — BP 142/80 | HR 76 | Ht 60.0 in | Wt 95.8 lb

## 2024-01-19 VITALS — BP 152/81 | HR 79 | Ht 60.0 in | Wt 96.0 lb

## 2024-01-19 DIAGNOSIS — I639 Cerebral infarction, unspecified: Secondary | ICD-10-CM

## 2024-01-19 DIAGNOSIS — I671 Cerebral aneurysm, nonruptured: Secondary | ICD-10-CM | POA: Diagnosis not present

## 2024-01-19 DIAGNOSIS — T148XXA Other injury of unspecified body region, initial encounter: Secondary | ICD-10-CM

## 2024-01-19 DIAGNOSIS — I6930 Unspecified sequelae of cerebral infarction: Secondary | ICD-10-CM | POA: Diagnosis not present

## 2024-01-19 NOTE — Progress Notes (Unsigned)
 Assessment : 83 year old lady who had an episode of left-sided facial numbness and difficulty moving upper and lower lip came to the hospital.  Here she was evaluated and found to have a right sided small acute stroke.  Further workup demonstrated her to have a 4 mm right MCA bifurcation aneurysm.  Patient was referred to us . [name] doing better and says that the facial numbness has gotten better.  She still feels that her lip is not moving as well but otherwise she is in good condition.   Plan : I reviewed the MR angiogram results with her and congratulated her that it was a very small stroke and aspirin  should be sufficient for the treatment.  With regards to the aneurysm, I told her that the risk of this aneurysm bleeding is less than 1% a year.  At her age of 67, I think treatment risk but by far outweigh the risk of the aneurysm and I told her that if she was my mother, I would not recommend any further follow-up and would not recommend any treatment for this.  She says that if she dies, she does not want to have any CPR and is ready to meet her maker and I told her that if the aneurysm does rupture that this could be the case.  She states that she is perfectly comfortable with that and we decided that we would not get any follow-up imaging and she is welcome to come and see me as needed.  She says that she would let me know if she starts having any headaches.   Social History   Socioeconomic History   Marital status: Widowed    Spouse name: Not on file   Number of children: 4   Years of education: H/S   Highest education level: High school graduate  Occupational History   Occupation: Retired  Tobacco Use   Smoking status: Former    Current packs/day: 0.00    Average packs/day: 1 pack/day for 2.0 years (2.0 ttl pk-yrs)    Types: Cigarettes    Start date: 06/29/1957    Quit date: 06/30/1959    Years since quitting: 64.6   Smokeless tobacco: Never  Vaping Use   Vaping status:  Never Used  Substance and Sexual Activity   Alcohol use: Not Currently   Drug use: No   Sexual activity: Not on file  Other Topics Concern   Not on file  Social History Narrative   Not on file   Social Drivers of Health   Financial Resource Strain: Low Risk  (10/04/2019)   Overall Financial Resource Strain (CARDIA)    Difficulty of Paying Living Expenses: Not hard at all  Food Insecurity: No Food Insecurity (01/15/2024)   Hunger Vital Sign    Worried About Running Out of Food in the Last Year: Never true    Ran Out of Food in the Last Year: Never true  Transportation Needs: No Transportation Needs (01/15/2024)   PRAPARE - Administrator, Civil Service (Medical): No    Lack of Transportation (Non-Medical): No  Physical Activity: Inactive (10/04/2019)   Exercise Vital Sign    Days of Exercise per Week: 0 days    Minutes of Exercise per Session: 0 min  Stress: No Stress Concern Present (10/04/2019)   [employer] of Occupational Health - Occupational Stress Questionnaire    Feeling of Stress : Not at all  Social Connections: Moderately Integrated (01/15/2024)   Social Connection and Isolation Panel  Frequency of Communication with Friends and Family: More than three times a week    Frequency of Social Gatherings with Friends and Family: Twice a week    Attends Religious Services: More than 4 times per year    Active Member of [employer] or Organizations: Yes    Attends Banker Meetings: More than 4 times per year    Marital Status: Widowed  Intimate Partner Violence: Not At Risk (01/15/2024)   Humiliation, Afraid, Rape, and Kick questionnaire    Fear of Current or Ex-Partner: No    Emotionally Abused: No    Physically Abused: No    Sexually Abused: No    Family History  Problem Relation Age of Onset   Hypertension Mother    Arthritis Mother    Stroke Mother    Cancer Father        unknown origin   Aneurysm Father    Hypertension Brother    Diabetes  Brother    Coronary artery disease Brother    Heart attack Brother    COPD Brother    Cancer Brother        lung   Early death Brother        Died as infant   Pneumonia Brother    Diabetes Son    Diabetes Maternal Grandmother    Colon cancer Maternal Grandfather    Breast cancer Neg Hx     Allergies  Allergen Reactions   Atacand  [Candesartan] Shortness Of Breath   Iodine Shortness Of Breath   Other Anaphylaxis    Melons of any kind.   Demerol [Meperidine] Hives   Propoxyphene Hives    Darvon, Darvocet   Ramipril Swelling    Paresthesias.   Penicillins Rash    Past Medical History:  Diagnosis Date   Hypertension    TIA (transient ischemic attack)     Past Surgical History:  Procedure Laterality Date   ABDOMINAL HYSTERECTOMY  2006   APPENDECTOMY     BREAST SURGERY Right 1985   lumpectomy x 2   BREAST SURGERY Right 1996   milk duct removal   DILATION AND CURETTAGE OF UTERUS     EYE SURGERY Bilateral    cataract/ glaucoma   HEMORROIDECTOMY       Physical Exam HENT:     Head: Normocephalic.     Nose: Nose normal.  Eyes:     Pupils: Pupils are equal, round, and reactive to light.  Cardiovascular:     Rate and Rhythm: Normal rate.  Pulmonary:     Effort: Pulmonary effort is normal.  Abdominal:     General: Abdomen is flat.  Musculoskeletal:     Cervical back: Normal range of motion.  Neurological:     Mental Status: [name] alert.     Cranial Nerves: Cranial nerves 2-12 are intact.     Sensory: Sensation is intact.     Motor: Motor function is intact.     Coordination: Coordination is intact.        Results for orders placed or performed during the hospital encounter of 01/15/24  MR BRAIN WO CONTRAST   Narrative   EXAM: MRI BRAIN WITHOUT CONTRAST MRA HEAD WITHOUT CONTRAST MRA NECK WITH AND WITHOUT CONTRAST 01/15/2024 02:44:04 PM  TECHNIQUE: Multiplanar multisequence MRI of the brain was performed without the administration of intravenous  contrast. MRA of the head was performed without contrast using time-of-flight technique. MRA of the neck was performed with and without contrast. 3D postprocessing  with multiplanar reformations and MIPs was performed for better evaluation of the vasculature. Stenosis of the cervical internal carotid arteries is measured using NASCET criteria.  COMPARISON: None available.  CLINICAL HISTORY: Neuro deficit, acute, stroke suspected. Patient states numbness to left side of face for 2 days; denies slurred speech and headaches. Patient ambulating with no difficulty.  FINDINGS:  MRI BRAIN:  BRAIN AND VENTRICLES: Acute infarct in the posterior right frontal subcortical white matter underlying the right precentral gyrus. No acute hemorrhage or significant mass effect. Background of moderate chronic small vessel disease. Scattered chronic micro hemorrhages in a predominantly subcortical distribution.  ORBITS: No acute abnormality.  SINUSES AND MASTOIDS: No acute abnormality.  BONES AND SOFT TISSUES: Normal bone marrow signal. No acute soft tissue abnormality.  MRA HEAD:  ANTERIOR CIRCULATION: 4 mm aneurysm arising from the right MCA M1 segment at the branch point of the right anterior temporal branch seen on coronal image 194 series 1076. No large vessel occlusion or significant stenosis of the bilateral MCAs. No significant stenosis of the anterior cerebral arteries. No significant stenosis of the intracranial internal carotid arteries.  POSTERIOR CIRCULATION: No significant stenosis of the posterior cerebral arteries. No significant stenosis of the basilar artery. No significant stenosis of the vertebral arteries. No aneurysm.  MRA NECK:  CERVICAL CAROTID ARTERIES: No dissection. No hemodynamically significant stenosis by NASCET criteria.  CERVICAL VERTEBRAL ARTERIES: No dissection. No significant stenosis.  IMPRESSION: 1. Acute infarct in the posterior right frontal  subcortical white matter underlying the right precentral gyrus. No acute hemorrhage or significant mass effect. 2. No large vessel occlusion or significant stenosis of the head or neck vessels. 3. A 4 mm aneurysm arising from the right MCA M1 segment at the branch point of the right anterior temporal branch. 4. Moderate chronic small vessel disease with scattered chronic microhemorrhages in a predominantly subcortical distribution, compatible with hypertensive microangiopathy.  Electronically signed by: Ryan Chess MD 01/15/2024 03:32 PM EDT RP Workstation: [id number]   MR ANGIO HEAD WO CONTRAST   Narrative   EXAM: MRI BRAIN WITHOUT CONTRAST MRA HEAD WITHOUT CONTRAST MRA NECK WITH AND WITHOUT CONTRAST 01/15/2024 02:44:04 PM  TECHNIQUE: Multiplanar multisequence MRI of the brain was performed without the administration of intravenous contrast. MRA of the head was performed without contrast using time-of-flight technique. MRA of the neck was performed with and without contrast. 3D postprocessing with multiplanar reformations and MIPs was performed for better evaluation of the vasculature. Stenosis of the cervical internal carotid arteries is measured using NASCET criteria.  COMPARISON: None available.  CLINICAL HISTORY: Neuro deficit, acute, stroke suspected. Patient states numbness to left side of face for 2 days; denies slurred speech and headaches. Patient ambulating with no difficulty.  FINDINGS:  MRI BRAIN:  BRAIN AND VENTRICLES: Acute infarct in the posterior right frontal subcortical white matter underlying the right precentral gyrus. No acute hemorrhage or significant mass effect. Background of moderate chronic small vessel disease. Scattered chronic micro hemorrhages in a predominantly subcortical distribution.  ORBITS: No acute abnormality.  SINUSES AND MASTOIDS: No acute abnormality.  BONES AND SOFT TISSUES: Normal bone marrow signal. No acute soft  tissue abnormality.  MRA HEAD:  ANTERIOR CIRCULATION: 4 mm aneurysm arising from the right MCA M1 segment at the branch point of the right anterior temporal branch seen on coronal image 194 series 1076. No large vessel occlusion or significant stenosis of the bilateral MCAs. No significant stenosis of the anterior cerebral arteries. No significant stenosis of the intracranial internal carotid  arteries.  POSTERIOR CIRCULATION: No significant stenosis of the posterior cerebral arteries. No significant stenosis of the basilar artery. No significant stenosis of the vertebral arteries. No aneurysm.  MRA NECK:  CERVICAL CAROTID ARTERIES: No dissection. No hemodynamically significant stenosis by NASCET criteria.  CERVICAL VERTEBRAL ARTERIES: No dissection. No significant stenosis.  IMPRESSION: 1. Acute infarct in the posterior right frontal subcortical white matter underlying the right precentral gyrus. No acute hemorrhage or significant mass effect. 2. No large vessel occlusion or significant stenosis of the head or neck vessels. 3. A 4 mm aneurysm arising from the right MCA M1 segment at the branch point of the right anterior temporal branch. 4. Moderate chronic small vessel disease with scattered chronic microhemorrhages in a predominantly subcortical distribution, compatible with hypertensive microangiopathy.  Electronically signed by: Ryan Chess MD 01/15/2024 03:32 PM EDT RP Workstation: [id number]   Results for orders placed or performed during the hospital encounter of 06/04/20  CT Head Wo Contrast   Narrative   CLINICAL DATA:  Fall with head trauma 3 days ago.  Headache.  EXAM: CT HEAD WITHOUT CONTRAST  TECHNIQUE: Contiguous axial images were obtained from the base of the skull through the vertex without intravenous contrast.  COMPARISON:  Head MRI 09/30/2016 and CT 05/21/2015  FINDINGS: Brain: There is no evidence of an acute infarct,  intracranial hemorrhage, mass, midline shift, or extra-axial fluid collection. The ventricles and sulci are within normal limits for age. Patchy hypodensities in the cerebral white matter bilaterally are similar to the prior CT and are nonspecific but compatible with mild-to-moderate chronic small vessel ischemic disease.  Vascular: Calcified atherosclerosis at the skull base. No hyperdense vessel.  Skull: No fracture or suspicious osseous lesion.  Sinuses/Orbits: Trace left mastoid air cell opacification. Visualized paranasal sinuses are clear. Visualized orbits are unremarkable.  Other: None.  IMPRESSION: 1. No evidence of acute intracranial abnormality. 2. Mild-to-moderate chronic small vessel ischemic disease.   Electronically Signed   By: Dasie Hamburg M.D.   On: 06/04/2020 12:27

## 2024-01-19 NOTE — Progress Notes (Signed)
 Established patient visit   Patient: Mariah Levy   DOB: 1941-06-24   83 y.o. Female  MRN: [id number] Visit Date: 01/19/2024  Today's healthcare provider: Rockie Agent, MD   Chief Complaint  Patient presents with   Hospitalization Follow-up    She would like to discuss medication and L abdominal area    Subjective     HPI     Hospitalization Follow-up    Additional comments: She would like to discuss medication and L abdominal area       Last edited by Thelbert Eulalio HERO, CMA on 01/19/2024  3:13 PM.       Discussed the use of AI scribe software for clinical note transcription with the patient, who gave verbal consent to proceed.  History of Present Illness Mariah Levy is an 83 year old female with a recent stroke and aneurysm who presents with a spreading hematoma in the left lower abdomen.  She has a spreading hematoma in her left lower abdomen following a needle injection on Saturday night. The area is sore, tender, and hard, with the swelling continuing to spread. She feels pressure under her rib and is concerned about potential internal bleeding, especially since she is on Plavix .  The hematoma was initially noticed on Sunday, and by Monday, the condition had significantly worsened, prompting her to seek further evaluation. [name] anxious about the situation, fearing ongoing bleeding.  Her current medications include Plavix , which she is supposed to take for 21 days, and Lipitor , which was increased from 40 mg to 80 mg. [name] also taking aspirin  as part of her post-stroke management. Her blood pressure has been elevated due to her anxiety about the hematoma.  In her recent medical history, she had a stroke and was diagnosed with a 4 mm aneurysm in the right MCA M1 segment. Her recent EKG showed normal sinus rhythm, and an echocardiogram revealed a normal ejection fraction with no shunt between the top chambers of her heart.  No bloody  stools.     Past Medical History:  Diagnosis Date   Hypertension    TIA (transient ischemic attack)     Medications: Outpatient Medications Prior to Visit  Medication Sig   amLODipine  (NORVASC ) 5 MG tablet Take 1 tablet (5 mg total) by mouth daily as needed (high BP/stress).   aspirin  EC 81 MG tablet Take 81 mg by mouth daily. Swallow whole.   atorvastatin  (LIPITOR ) 80 MG tablet Take 1 tablet (80 mg total) by mouth daily.   clopidogrel  (PLAVIX ) 75 MG tablet Take 1 tablet (75 mg total) by mouth daily.   MAGNESIUM PO Take 500 mg by mouth 2 (two) times daily.   Multiple Vitamins-Minerals (ICAPS AREDS 2 PO) Take by mouth daily.    triamcinolone  ointment (KENALOG ) 0.5 % Apply 1 Application topically 2 (two) times daily. (Patient not taking: Reported on 01/19/2024)   triamterene -hydrochlorothiazide  (MAXZIDE -25) 37.5-25 MG tablet TAKE 1 TABLET BY MOUTH EVERY DAY   TURMERIC PO Take by mouth daily. With Curcumin   No facility-administered medications prior to visit.    Review of Systems      Objective    BP (!) 152/81   Pulse 79   Ht 5' (1.524 m)   Wt 96 lb (43.5 kg)   SpO2 98%   BMI 18.75 kg/m      Physical Exam Vitals reviewed.  Constitutional:      General: [name] not in acute distress.    Appearance:  Normal appearance. [name] not ill-appearing.  Cardiovascular:     Rate and Rhythm: Normal rate and regular rhythm.  Pulmonary:     Effort: Pulmonary effort is normal. No respiratory distress.     Breath sounds: No wheezing, rhonchi or rales.  Abdominal:   Neurological:     Mental Status: [name] alert and oriented to person, place, and time.  Psychiatric:        Mood and Affect: Mood normal.        Behavior: Behavior normal.       No results found for any visits on 01/19/24.  Assessment & Plan     Problem List Items Addressed This Visit   None Visit Diagnoses       Hematoma    -  Primary   Relevant Orders   CT ANGIO ABDOMEN W &/OR WO CONTRAST        Assessment & Plan Expanding Hematoma Presents with an expanding hematoma in the left lower abdomen following a Lovenox  injection while on dual antiplatelet therapy (Plavix  and aspirin ). The hematoma is tender, hard, and spreading, causing significant discomfort and concern for active bleeding. There is a risk of anemia, potential need for blood transfusions, and cardiovascular strain if not addressed promptly. - Ordered urgent CT angiogram of the abdomen to assess for active bleeding. - Consult with vascular surgery for evaluation and guidance. - Hold Plavix  until further evaluation (imaging) is completed. - Advise to go to the emergency department for immediate evaluation and potential intervention if the hematoma continues to expand if imaging is delayed   Stroke and Aneurysm Management Has a stroke and a 4mm aneurysm in the right MCA M1 segment. She has been on dual antiplatelet therapy with aspirin  and Plavix  for 21 days as per the neurosurgeon's recommendation. The plan is to continue aspirin  81 mg daily after the 21-day course of Plavix . She has been educated about her condition and feels reassured after a recent consultation with a neurologist who explained the situation clearly. - Continue aspirin  81 mg daily after completing 21 days of Plavix . - Continue atorvastatin  80 mg daily for cholesterol management. F/u with neurosurgery as scheduled   Follow-up Requires follow-up for the expanding hematoma and ongoing management of her stroke and aneurysm. - Follow up with vascular surgery based on the outcome of the CT angiogram and emergency department evaluation. - Continue regular follow-up with neurology for stroke and aneurysm management.     No follow-ups on file.       Total time spent on today's visit was , including both face-to-face time interviewing and examining the patient, reviewing medical record including labs/imaging/specialist notes, developing and  discussing further evaluation,answering patient's questions, discussing plan with Vascular surgery on call, Dr. Marea, coordinating follow up care in addition to documenting in the patient's chart.    Rockie Agent, MD  [facility] [phone] (phone) [phone] (fax)  [facility]

## 2024-01-20 ENCOUNTER — Inpatient Hospital Stay: Payer: MEDICARE | Admitting: Physician Assistant

## 2024-01-20 ENCOUNTER — Telehealth: Payer: Self-pay

## 2024-01-20 ENCOUNTER — Telehealth: Payer: Self-pay | Admitting: Family Medicine

## 2024-01-20 DIAGNOSIS — Z91041 Radiographic dye allergy status: Secondary | ICD-10-CM

## 2024-01-20 MED ORDER — DIPHENHYDRAMINE HCL 50 MG PO TABS: ORAL_TABLET | ORAL | 0 refills | Status: DC

## 2024-01-20 MED ORDER — PREDNISONE 50 MG PO TABS: ORAL_TABLET | ORAL | 0 refills | Status: DC

## 2024-01-20 NOTE — Telephone Encounter (Signed)
 Copied from CRM [id number]. Topic: General - Other >> Jan 20, 2024  8:39 AM Marissa P wrote: Reason for CRM: Patient states Dr Sharma was supposed to schedule her an appointment with Dr. Marea. Is calling to confirm the appointment was made. She states she needs to get in as soon as possible before SATURDAY as she advised to the doctor Patient would also like to schedule her CT ANGIO ABDOMEN W &/OR WO CONTRAST (Order [id number]).    Patient is requesting a callback at [phone].

## 2024-01-20 NOTE — Telephone Encounter (Signed)
 Per verbally speaking with Dr. JONELLE, okay for patient to take medications with current med and problem list. Called to advise patient of this per Dr. JONELLE

## 2024-01-20 NOTE — Telephone Encounter (Signed)
 Copied from CRM [id number]. Topic: Appointments - Scheduling Inquiry for Clinic >> Jan 20, 2024  8:09 AM Mariah Levy wrote: Reason for CRM: Patient states Dr Sharma was supposed to schedule her an appointment with Dr. Marea. Is calling to confirm the appointment was made. She states she needs to get in as soon as possible. Patient would also like to schedule her CT ANGIO ABDOMEN W &/OR WO CONTRAST (Order [id number]).   Patient is requesting a callback at [phone].

## 2024-01-20 NOTE — Telephone Encounter (Signed)
 Please contact patient to let her know that she will need to complete a protocol for her CT consisting of steroids and benadryl    Pt will need to take 50MG  prednisone  13 hours prior to CT , another 50mg  dose at 7 prior to CT  and another 50mg  1 hour before imaging   She will also need to take 50MG  Benadryl  1 hour before CT

## 2024-01-20 NOTE — Telephone Encounter (Signed)
 I discussed her case with Dr. Marea who recommended the imaging. She does not need to schedule an appt with Dr. Marea.

## 2024-01-20 NOTE — Telephone Encounter (Signed)
 Called pt to give instructions, patient understands. Patient had concerns about benadryl - states she heard you are not supposed to take this if you have hypertension, wants to know if it is okay for her to take this. Patient has concerns about taking prednisone  with other meds she takes, explained to patient that you have reviewed med and problem list for interactions, but would send this to you to double check anyway.

## 2024-01-21 ENCOUNTER — Ambulatory Visit
Admission: RE | Admit: 2024-01-21 | Discharge: 2024-01-21 | Disposition: A | Discharge status: Home / Self Care | Payer: MEDICARE | Source: Ambulatory Visit | Attending: Family Medicine | Admitting: Family Medicine

## 2024-01-21 ENCOUNTER — Ambulatory Visit: Payer: Self-pay | Admitting: Family Medicine

## 2024-01-21 ENCOUNTER — Other Ambulatory Visit: Payer: Self-pay | Admitting: Family Medicine

## 2024-01-21 DIAGNOSIS — Z8673 Personal history of transient ischemic attack (TIA), and cerebral infarction without residual deficits: Secondary | ICD-10-CM | POA: Diagnosis not present

## 2024-01-21 DIAGNOSIS — K7689 Other specified diseases of liver: Secondary | ICD-10-CM | POA: Diagnosis not present

## 2024-01-21 DIAGNOSIS — Z7982 Long term (current) use of aspirin: Secondary | ICD-10-CM | POA: Insufficient documentation

## 2024-01-21 DIAGNOSIS — T148XXA Other injury of unspecified body region, initial encounter: Secondary | ICD-10-CM | POA: Diagnosis not present

## 2024-01-21 DIAGNOSIS — Z7902 Long term (current) use of antithrombotics/antiplatelets: Secondary | ICD-10-CM | POA: Insufficient documentation

## 2024-01-21 DIAGNOSIS — M7981 Nontraumatic hematoma of soft tissue: Secondary | ICD-10-CM | POA: Diagnosis not present

## 2024-01-21 DIAGNOSIS — I700 Atherosclerosis of aorta: Secondary | ICD-10-CM | POA: Insufficient documentation

## 2024-02-08 ENCOUNTER — Encounter: Payer: Self-pay | Admitting: Family Medicine

## 2024-02-08 ENCOUNTER — Ambulatory Visit: Payer: MEDICARE | Admitting: Family Medicine

## 2024-02-08 VITALS — BP 118/63 | HR 75 | Resp 16 | Ht 60.0 in | Wt 96.9 lb

## 2024-02-08 DIAGNOSIS — Z8673 Personal history of transient ischemic attack (TIA), and cerebral infarction without residual deficits: Secondary | ICD-10-CM | POA: Diagnosis not present

## 2024-02-08 DIAGNOSIS — I10 Essential (primary) hypertension: Secondary | ICD-10-CM | POA: Diagnosis not present

## 2024-02-08 DIAGNOSIS — H348312 Tributary (branch) retinal vein occlusion, right eye, stable: Secondary | ICD-10-CM

## 2024-02-08 DIAGNOSIS — E7800 Pure hypercholesterolemia, unspecified: Secondary | ICD-10-CM

## 2024-02-08 DIAGNOSIS — Z0001 Encounter for general adult medical examination with abnormal findings: Secondary | ICD-10-CM

## 2024-02-08 DIAGNOSIS — M7981 Nontraumatic hematoma of soft tissue: Secondary | ICD-10-CM | POA: Diagnosis not present

## 2024-02-08 DIAGNOSIS — Z0000 Encounter for general adult medical examination without abnormal findings: Secondary | ICD-10-CM

## 2024-02-08 DIAGNOSIS — T148XXA Other injury of unspecified body region, initial encounter: Secondary | ICD-10-CM

## 2024-02-08 MED ORDER — ATORVASTATIN CALCIUM 80 MG PO TABS: 80.0000 mg | ORAL_TABLET | Freq: Every day | ORAL | 1 refills | Status: AC

## 2024-02-08 NOTE — Progress Notes (Signed)
 Annual Wellness Visit     Patient: Mariah Levy, Female    DOB: [birth year], 83 y.o.   MRN: [id number]  Subjective  Chief Complaint  Patient presents with   Annual Exam   Medicare Wellness    Mariah Levy is a 83 y.o. female who presents today for her Annual Wellness Visit. She reports consuming a general diet. Home exercise routine includes walking .5 hrs per 3-4 times a week. She generally feels well. She reports sleeping fairly well. She does have additional problems to discuss today.   She reports having a stroke recently and would like to follow up on her hospitalization. She states that she was given a 20 day supply of DAPT, but was advised to stop early because of bleeding (abdominal hematoma). Since then, she reports being confused what medications she should be taking. She endorses resolution of her hematoma.  She reports no other concerns today.  HPI       Medications: Outpatient Medications Prior to Visit  Medication Sig   amLODipine  (NORVASC ) 5 MG tablet Take 1 tablet (5 mg total) by mouth daily as needed (high BP/stress).   aspirin  EC 81 MG tablet Take 81 mg by mouth daily. Swallow whole.   MAGNESIUM PO Take 500 mg by mouth 2 (two) times daily.   Multiple Vitamins-Minerals (ICAPS AREDS 2 PO) Take by mouth daily.    triamterene -hydrochlorothiazide  (MAXZIDE -25) 37.5-25 MG tablet TAKE 1 TABLET BY MOUTH EVERY DAY   TURMERIC PO Take by mouth daily. With Curcumin   [DISCONTINUED] atorvastatin  (LIPITOR ) 80 MG tablet Take 1 tablet (80 mg total) by mouth daily.   [DISCONTINUED] clopidogrel  (PLAVIX ) 75 MG tablet Take 1 tablet (75 mg total) by mouth daily.   [DISCONTINUED] diphenhydrAMINE  (BENADRYL ) 50 MG tablet Take one 50mg  tab by mouth 1 hour prior to CT imaging   [DISCONTINUED] predniSONE  (DELTASONE ) 50 MG tablet Take 1 50mg  tab by mouth 13 hours prior to imaging, 50mg  tab 7 hours prior to CT and 50mg  tab 1 hour prior to CT   [DISCONTINUED] triamcinolone  ointment  (KENALOG ) 0.5 % Apply 1 Application topically 2 (two) times daily. (Patient not taking: Reported on 01/19/2024)   No facility-administered medications prior to visit.    Allergies  Allergen Reactions   Atacand  [Candesartan] Shortness Of Breath   Iodine Shortness Of Breath   Other Anaphylaxis    Melons of any kind.   Demerol [Meperidine] Hives   Propoxyphene Hives    Darvon, Darvocet   Ramipril Swelling    Paresthesias.   Penicillins Rash    Patient Care Team: Myrla Jon HERO, MD as PCP - General (Family Medicine) Mittie Gaskin, MD as Referring Physician (Ophthalmology) Rosslyn Dino HERO, MD as Consulting Physician (Neurosurgery)  ROS      Objective  BP 118/63 Comment: this morning at home  Pulse 75   Resp 16   Ht 5' (1.524 m)   Wt 96 lb 14.4 oz (44 kg)   SpO2 100%   BMI 18.92 kg/m    Physical Exam Vitals reviewed.  Constitutional:      General: [name] not in acute distress.    Appearance: Normal appearance. [name] not ill-appearing or diaphoretic.  HENT:     Head: Normocephalic.     Right Ear: Tympanic membrane, ear canal and external ear normal.     Left Ear: Tympanic membrane, ear canal and external ear normal.     Nose: Nose normal.     Mouth/Throat:  Mouth: Mucous membranes are moist.     Pharynx: Oropharynx is clear. No oropharyngeal exudate or posterior oropharyngeal erythema.     Comments: Slight left-sided mouth droop residual to recent stroke. Eyes:     General: No scleral icterus.    Conjunctiva/sclera: Conjunctivae normal.     Pupils: Pupils are equal, round, and reactive to light.  Cardiovascular:     Rate and Rhythm: Normal rate and regular rhythm.     Pulses: Normal pulses.     Heart sounds: Normal heart sounds. No murmur heard.    No friction rub. No gallop.  Pulmonary:     Effort: Pulmonary effort is normal. No respiratory distress.     Breath sounds: Normal breath sounds. No wheezing.  Abdominal:     General: There is no  distension.     Palpations: Abdomen is soft.     Tenderness: There is no abdominal tenderness. There is no guarding.  Musculoskeletal:     Cervical back: Normal range of motion.     Right lower leg: No edema.     Left lower leg: No edema.  Lymphadenopathy:     Cervical: No cervical adenopathy.  Skin:    General: Skin is warm and dry.     Capillary Refill: Capillary refill takes less than 2 seconds.  Neurological:     Mental Status: [name] alert and oriented to person, place, and time.     Motor: Weakness (Residual left upper extremity weakness with intact functionality) present.  Psychiatric:        Mood and Affect: Mood normal.       Most recent functional status assessment:    02/08/2024    1:54 PM  In your present state of health, do you have any difficulty performing the following activities:  Hearing? 0  Vision? 0  Difficulty concentrating or making decisions? 0  Walking or climbing stairs? 0  Dressing or bathing? 0  Doing errands, shopping? 0   Most recent fall risk assessment:    02/08/2024    1:49 PM  Fall Risk   Falls in the past year? 0  Number falls in past yr: 0  Injury with Fall? 0  Risk for fall due to : No Fall Risks    Most recent depression screenings:    02/08/2024    1:49 PM 07/06/2023    1:56 PM  PHQ 2/9 Scores  PHQ - 2 Score 0 0  PHQ- 9 Score  0   Most recent cognitive screening:    02/08/2024    1:55 PM  6CIT Screen  What Year? 0 points  What month? 0 points  What time? 0 points  Count back from 20 0 points  Months in reverse 0 points  Repeat phrase 4 points  Total Score 4 points   Most recent Audit-C alcohol use screening    11/11/2022    1:18 PM  Alcohol Use Disorder Test (AUDIT)  1. How often do you have a drink containing alcohol? 0  2. How many drinks containing alcohol do you have on a typical day when you are drinking? 0  3. How often do you have six or more drinks on one occasion? 0  AUDIT-C Score 0   A score of 3 or  more in women, and 4 or more in men indicates increased risk for alcohol abuse, EXCEPT if all of the points are from question 1   Vision/Hearing Screen: No results found.    No results found  for any visits on 02/08/24.    Assessment & Plan   Annual wellness visit done today including the all of the following: Reviewed patient's Family Medical History Reviewed and updated list of patient's medical providers Assessment of cognitive impairment was done Assessed patient's functional ability Established a written schedule for health screening services Health Risk Assessent Completed and Reviewed  Exercise Activities and Dietary recommendations  Goals      Exercise 150 minutes per week (moderate activity)        Immunization History  Administered Date(s) Administered   Tdap 04/18/2011    Health Maintenance  Topic Date Due   Pneumococcal Vaccine: 50+ Years (1 of 1 - PCV) Never done   Zoster Vaccines- Shingrix (1 of 2) Never done   DTaP/Tdap/Td (2 - Td or Tdap) 04/17/2021   COVID-19 Vaccine (1 - 2024-25 season) Never done   INFLUENZA VACCINE  09/27/2024 (Originally 01/29/2024)   Medicare Annual Wellness (AWV)  02/07/2025   DEXA SCAN  Completed   Hepatitis B Vaccines  Aged Out   HPV VACCINES  Aged Out   Meningococcal B Vaccine  Aged Out     Discussed health benefits of physical activity, and encouraged her to engage in regular exercise appropriate for her age and condition.    Problem List Items Addressed This Visit     Hypercholesteremia   Currently not controlled. Recently experienced a stroke in July and was started on statin. Last FLP was elevated with cholesterol and LDL. Patient has had questions about starting the medication and has not started. - Start atorvastatin  80 mg daily - Order FLP at next follow up       Relevant Medications   atorvastatin  (LIPITOR ) 80 MG tablet   Essential hypertension   Currently well controlled on medication regimen. Only taking  amlodipine  for blood pressure elevations. Denies any symptoms or adverse medication side effects. Last CMP was stable. - Continue triamterene -hydrochlorothiazide  37.5-25mg  daily - Order CMP at next follow up       Relevant Medications   atorvastatin  (LIPITOR ) 80 MG tablet   Hx of TIA (transient ischemic attack) and stroke   Currently managed with baby aspirin  and atorvastatin . Recently experienced MCA stroke for which her atorvastatin  was increased to 80 mg. Reports not taking either medication right now because of medication confusion. Emphasized the role of these medications in stroke prevention. - Start baby aspirin  daily - Start atorvastatin  80 mg daily      Stroke [facility])   Currently not managed. Had a recent MCA stroke with hospitalization. Discharged with DAPT for 20 days, but discontinued due to abdominal hematoma (which has since resolved). Currently recommended to take aspirin  and atorvastatin  for further stroke prevention, but patient not taking due to medication confusion. Discussed importance of medications in stroke prevention. Residual left lip droop with left upper extremity weakness, but intact functionality. - Start baby aspirin  daily - Start atorvastatin  80 mg daily      Relevant Medications   atorvastatin  (LIPITOR ) 80 MG tablet   Other Visit Diagnoses       Encounter for annual wellness visit (AWV) in Medicare patient    -  Primary     Hematoma          Hematoma Patient recently experienced abdominal hematoma after starting DAPT. Discontinued DAPT with resolution of hematoma on exam today. 20-day window for Plavix  has elapsed. - Continued observation  General Health Maintenance Discussed wellness and preventative health screenings to achieve health maintenance goals. - Encouraged  flu and Covid vaccinations in the fall - Declined all vaccinations including tetanus, pneumonia, and shingles - Up to date on other wellness measures and preventative  screenings   Return in about 6 months (around 08/10/2024) for chronic disease f/u.     Elia LULLA Blanch, Medical Student   Patient seen along with MS3 student, Elia Blanch. I personally evaluated this patient along with the student, and verified all aspects of the history, physical exam, and medical decision making as documented by the student. I agree with the student's documentation and have made all necessary edits.  Tatelyn Vanhecke, Jon HERO, MD, MPH [facility]

## 2024-02-08 NOTE — Assessment & Plan Note (Signed)
 Currently managed with baby aspirin  and atorvastatin . Recently experienced MCA stroke for which her atorvastatin  was increased to 80 mg. Reports not taking either medication right now because of medication confusion. Emphasized the role of these medications in stroke prevention. - Start baby aspirin  daily - Start atorvastatin  80 mg daily

## 2024-02-08 NOTE — Assessment & Plan Note (Signed)
 Currently not managed. Had a recent MCA stroke with hospitalization. Discharged with DAPT for 20 days, but discontinued due to abdominal hematoma (which has since resolved). Currently recommended to take aspirin  and atorvastatin  for further stroke prevention, but patient not taking due to medication confusion. Discussed importance of medications in stroke prevention. Residual left lip droop with left upper extremity weakness, but intact functionality. - Start baby aspirin  daily - Start atorvastatin  80 mg daily

## 2024-02-08 NOTE — Assessment & Plan Note (Signed)
 Currently not controlled. Recently experienced a stroke in July and was started on statin. Last FLP was elevated with cholesterol and LDL. Patient has had questions about starting the medication and has not started. - Start atorvastatin  80 mg daily - Order FLP at next follow up

## 2024-02-08 NOTE — Assessment & Plan Note (Signed)
 Currently well controlled on medication regimen. Only taking amlodipine  for blood pressure elevations. Denies any symptoms or adverse medication side effects. Last CMP was stable. - Continue triamterene -hydrochlorothiazide  37.5-25mg  daily - Order CMP at next follow up

## 2024-02-22 DIAGNOSIS — H34831 Tributary (branch) retinal vein occlusion, right eye, with macular edema: Secondary | ICD-10-CM | POA: Diagnosis not present

## 2024-03-25 ENCOUNTER — Other Ambulatory Visit: Payer: Self-pay | Admitting: Family Medicine

## 2024-04-07 DIAGNOSIS — H0015 Chalazion left lower eyelid: Secondary | ICD-10-CM | POA: Diagnosis not present

## 2024-05-02 DIAGNOSIS — H34831 Tributary (branch) retinal vein occlusion, right eye, with macular edema: Secondary | ICD-10-CM | POA: Diagnosis not present

## 2024-08-11 ENCOUNTER — Ambulatory Visit: Payer: MEDICARE | Admitting: Family Medicine
